# Patient Record
Sex: Female | Born: 1965
Health system: Southern US, Community
[De-identification: ages and names within clinical notes are randomized; demographics above are authoritative.]

## PROBLEM LIST (undated history)

## (undated) DIAGNOSIS — E119 Type 2 diabetes mellitus without complications: Secondary | ICD-10-CM

## (undated) DIAGNOSIS — F419 Anxiety disorder, unspecified: Secondary | ICD-10-CM

## (undated) DIAGNOSIS — I10 Essential (primary) hypertension: Secondary | ICD-10-CM

## (undated) HISTORY — PX: WRIST SURGERY: SHX841

## (undated) HISTORY — PX: CHOLECYSTECTOMY: SHX55

---

## 2004-12-09 ENCOUNTER — Ambulatory Visit: Payer: Self-pay | Admitting: General Surgery

## 2005-02-28 ENCOUNTER — Ambulatory Visit: Payer: Self-pay | Admitting: Family Medicine

## 2006-12-07 ENCOUNTER — Ambulatory Visit: Payer: Self-pay | Admitting: Family Medicine

## 2007-11-29 ENCOUNTER — Ambulatory Visit: Payer: Self-pay | Admitting: Family Medicine

## 2007-12-12 ENCOUNTER — Ambulatory Visit: Payer: Self-pay | Admitting: Obstetrics and Gynecology

## 2008-11-21 ENCOUNTER — Emergency Department: Payer: Self-pay | Admitting: Emergency Medicine

## 2008-12-07 ENCOUNTER — Emergency Department: Payer: Self-pay | Admitting: Unknown Physician Specialty

## 2009-08-25 ENCOUNTER — Emergency Department: Payer: Self-pay | Admitting: Emergency Medicine

## 2009-08-26 ENCOUNTER — Ambulatory Visit: Payer: Self-pay | Admitting: Orthopedic Surgery

## 2009-08-27 ENCOUNTER — Ambulatory Visit: Payer: Self-pay | Admitting: Orthopedic Surgery

## 2010-07-19 ENCOUNTER — Ambulatory Visit: Payer: Self-pay

## 2011-04-14 ENCOUNTER — Ambulatory Visit: Payer: Self-pay | Admitting: Family Medicine

## 2011-06-20 ENCOUNTER — Ambulatory Visit: Payer: Self-pay | Admitting: Family Medicine

## 2011-07-15 ENCOUNTER — Ambulatory Visit: Payer: Self-pay | Admitting: Family Medicine

## 2011-08-15 ENCOUNTER — Ambulatory Visit: Payer: Self-pay | Admitting: Family Medicine

## 2012-02-14 ENCOUNTER — Ambulatory Visit: Payer: Self-pay | Admitting: Family Medicine

## 2012-06-19 ENCOUNTER — Ambulatory Visit: Payer: Self-pay | Admitting: Specialist

## 2012-06-19 DIAGNOSIS — Z01810 Encounter for preprocedural cardiovascular examination: Secondary | ICD-10-CM

## 2012-06-19 LAB — FOLATE: Folic Acid: 17.1 ng/mL

## 2012-06-19 LAB — COMPREHENSIVE METABOLIC PANEL WITH GFR
Albumin: 3 g/dL — ABNORMAL LOW
Alkaline Phosphatase: 62 U/L
Anion Gap: 9
BUN: 10 mg/dL
Bilirubin,Total: 0.3 mg/dL
Calcium, Total: 8.5 mg/dL
Chloride: 107 mmol/L
Co2: 25 mmol/L
Creatinine: 0.65 mg/dL
EGFR (African American): >60
EGFR (Non-African Amer.): >60
Glucose: 102 mg/dL — ABNORMAL HIGH
Osmolality: 280
Potassium: 3.7 mmol/L
SGOT(AST): 15 U/L
SGPT (ALT): 14 U/L
Sodium: 141 mmol/L
Total Protein: 7.3 g/dL

## 2012-06-19 LAB — CBC WITH DIFFERENTIAL/PLATELET
Basophil #: 0.1 x10 3/mm 3
Basophil %: 0.6 %
Eosinophil #: 0.5 x10 3/mm 3
Eosinophil %: 4.9 %
HCT: 34.7 % — ABNORMAL LOW
HGB: 11.7 g/dL — ABNORMAL LOW
Lymphocyte %: 38.3 %
Lymphs Abs: 3.6 x10 3/mm 3
MCH: 30.5 pg
MCHC: 33.6 g/dL
MCV: 91 fL
Monocyte #: 0.5 "x10 3/mm "
Monocyte %: 5.2 %
Neutrophil #: 4.8 x10 3/mm 3
Neutrophil %: 51 %
Platelet: 305 x10 3/mm 3
RBC: 3.82 X10 6/mm 3
RDW: 14 %
WBC: 9.4 x10 3/mm 3

## 2012-06-19 LAB — FERRITIN: Ferritin (ARMC): 29 ng/mL (ref 8–388)

## 2012-06-19 LAB — LIPASE, BLOOD: Lipase: 90 U/L

## 2012-06-19 LAB — HEMOGLOBIN A1C: Hemoglobin A1C: 6.2 %

## 2012-06-19 LAB — BILIRUBIN, DIRECT: Bilirubin, Direct: <0.1 mg/dL

## 2012-06-19 LAB — IRON AND TIBC
Iron Bind.Cap.(Total): 414 ug/dL
Iron Saturation: 14 %
Iron: 58 ug/dL
Unbound Iron-Bind.Cap.: 356 ug/dL

## 2012-06-19 LAB — MAGNESIUM: Magnesium: 1.7 mg/dL — ABNORMAL LOW

## 2012-06-19 LAB — AMYLASE: Amylase: 31 U/L

## 2012-06-19 LAB — PROTIME-INR
INR: 1
Prothrombin Time: 13.7 s

## 2012-06-19 LAB — APTT: Activated PTT: 27.2 s

## 2012-06-19 LAB — PHOSPHORUS: Phosphorus: 3.5 mg/dL

## 2012-07-08 ENCOUNTER — Ambulatory Visit: Payer: Self-pay | Admitting: Specialist

## 2012-07-14 ENCOUNTER — Ambulatory Visit: Payer: Self-pay | Admitting: Specialist

## 2014-01-08 DIAGNOSIS — M5430 Sciatica, unspecified side: Secondary | ICD-10-CM | POA: Insufficient documentation

## 2014-01-08 DIAGNOSIS — E119 Type 2 diabetes mellitus without complications: Secondary | ICD-10-CM | POA: Insufficient documentation

## 2014-01-08 DIAGNOSIS — I10 Essential (primary) hypertension: Secondary | ICD-10-CM | POA: Insufficient documentation

## 2014-02-04 ENCOUNTER — Ambulatory Visit: Payer: Self-pay | Admitting: Family Medicine

## 2014-03-27 DIAGNOSIS — D649 Anemia, unspecified: Secondary | ICD-10-CM | POA: Insufficient documentation

## 2014-04-22 DIAGNOSIS — S62102A Fracture of unspecified carpal bone, left wrist, initial encounter for closed fracture: Secondary | ICD-10-CM | POA: Insufficient documentation

## 2014-05-04 DIAGNOSIS — N83201 Unspecified ovarian cyst, right side: Secondary | ICD-10-CM | POA: Insufficient documentation

## 2014-06-15 ENCOUNTER — Ambulatory Visit: Payer: Self-pay | Admitting: Family Medicine

## 2015-02-26 ENCOUNTER — Encounter: Payer: Self-pay | Admitting: Emergency Medicine

## 2015-02-26 ENCOUNTER — Emergency Department
Admission: EM | Admit: 2015-02-26 | Discharge: 2015-02-26 | Disposition: A | Discharge status: Home / Self Care | Payer: Self-pay | Attending: Emergency Medicine | Admitting: Emergency Medicine

## 2015-02-26 DIAGNOSIS — J069 Acute upper respiratory infection, unspecified: Secondary | ICD-10-CM | POA: Insufficient documentation

## 2015-02-26 DIAGNOSIS — Z79899 Other long term (current) drug therapy: Secondary | ICD-10-CM | POA: Insufficient documentation

## 2015-02-26 DIAGNOSIS — E119 Type 2 diabetes mellitus without complications: Secondary | ICD-10-CM | POA: Insufficient documentation

## 2015-02-26 DIAGNOSIS — I10 Essential (primary) hypertension: Secondary | ICD-10-CM | POA: Insufficient documentation

## 2015-02-26 HISTORY — DX: Anxiety disorder, unspecified: F41.9

## 2015-02-26 HISTORY — DX: Essential (primary) hypertension: I10

## 2015-02-26 HISTORY — DX: Type 2 diabetes mellitus without complications: E11.9

## 2015-02-26 LAB — POCT RAPID STREP A: STREPTOCOCCUS, GROUP A SCREEN (DIRECT): NEGATIVE

## 2015-02-26 MED ORDER — AMOXICILLIN 500 MG PO CAPS: 500.0000 mg | ORAL_CAPSULE | Freq: Three times a day (TID) | ORAL | Status: DC

## 2015-02-26 MED ORDER — BENZONATATE 100 MG PO CAPS: ORAL_CAPSULE | ORAL | Status: DC

## 2015-02-26 NOTE — ED Notes (Signed)
Patient with no complaints at this time. Respirations even and unlabored. Skin warm/dry. Discharge instructions reviewed with patient at this time. Patient given opportunity to voice concerns/ask questions. Patient discharged at this time and left Emergency Department with steady gait.   

## 2015-02-26 NOTE — ED Provider Notes (Signed)
[facility] Emergency Department Provider Note  ____________________________________________  Time seen: Approximately 6:11 PM  I have reviewed the triage vital signs and the nursing notes.   HISTORY  Chief Complaint Sore Throat   HPI Valerie Hamilton is a 49 y.o. female is here today with complaint of sore throat.Patient also complains of low-grade temp, productive cough, and congestion. She's been taking some over-the-counter medication without any improvement. She states the cough is keeping her up at night. She has continued to eat and drink without any difficulty.   she rates her pain a 5 out of 10.   Past Medical History  Diagnosis Date  . Diabetes mellitus without complication   . Hypertension   . Anxiety     There are no active problems to display for this patient.   Past Surgical History  Procedure Laterality Date  . Cholecystectomy    . Wrist surgery Left     Current Outpatient Rx  Name  Route  Sig  Dispense  Refill  . hydrochlorothiazide (HYDRODIURIL) 25 MG tablet   Oral   Take 25 mg by mouth daily.         Marland Kitchen amoxicillin (AMOXIL) 500 MG capsule   Oral   Take 1 capsule (500 mg total) by mouth 3 (three) times daily.   30 capsule   0   . benzonatate (TESSALON PERLES) 100 MG capsule      Take 1-2 caps every 8 hours for cough   30 capsule   0     Allergies Avelox and Lisinopril  No family history on file.  Social History History  Substance Use Topics  . Smoking status: Never Smoker   . Smokeless tobacco: Not on file  . Alcohol Use: Yes    Review of Systems Constitutional: Positive fever/chills Eyes: No visual changes. ENT: Positive sore throat. Cardiovascular: Denies chest pain. Respiratory: Denies shortness of breath. Gastrointestinal: No abdominal pain.  No nausea, no vomiting.  No diarrhea.  . Genitourinary: Negative for dysuria. Musculoskeletal: Negative for back pain. Skin: Negative for  rash. Neurological: Negative for headaches, focal weakness or numbness.  10-point ROS otherwise negative.  ____________________________________________   PHYSICAL EXAM:  VITAL SIGNS: ED Triage Vitals  Enc Vitals Group     BP 02/26/15 1758 139/96 mmHg     Pulse Rate 02/26/15 1758 105     Resp 02/26/15 1758 20     Temp 02/26/15 1758 99.3 F (37.4 C)     Temp Source 02/26/15 1758 Oral     SpO2 02/26/15 1758 99 %     Weight 02/26/15 1758 398 lb (180.532 kg)     Height 02/26/15 1758  (1.626 m)     Head Cir --      Peak Flow --      Pain Score 02/26/15 1755 5     Pain Loc --      Pain Edu? --      Excl. in GC? --     Constitutional: Alert and oriented. Well appearing and in no acute distress. Eyes: Conjunctivae are normal. PERRL. EOMI. Head: Atraumatic. Nose: No congestion/rhinnorhea. Mouth/Throat: Mucous membranes are moist.  Oropharynx non-erythematous with posterior drainage. Neck: No stridor.  Supple Hematological/Lymphatic/Immunilogical: No cervical lymphadenopathy. Cardiovascular: Normal rate, regular rhythm. Grossly normal heart sounds.  Good peripheral circulation. Respiratory: Normal respiratory effort.  No retractions. Lungs CTAB. Congested cough Gastrointestinal: Soft and nontender. No distention. No abdominal bruits. No CVA tenderness. Musculoskeletal: No lower extremity tenderness nor edema.  No joint effusions. Neurologic:  Normal speech and language. No gross focal neurologic deficits are appreciated. No gait instability. Skin:  Skin is warm, dry and intact. No rash noted. Psychiatric: Mood and affect are normal. Speech and behavior are normal.  ____________________________________________   LABS (all labs ordered are listed, but only abnormal results are displayed)  Labs Reviewed  POCT RAPID STREP A   _ PROCEDURES  Procedure(s) performed: None  Critical Care performed: No  ____________________________________________   INITIAL IMPRESSION /  ASSESSMENT AND PLAN / ED COURSE  Pertinent labs & imaging results that were available during my care of the patient were reviewed by me and considered in my medical decision making (see chart for details).  Patient was given a prescription for Tessalon Perles and amoxicillin 500 mg. She was told this is probably viral in nature however she is certain that this is much more than that. She is to follow-up with her PCP if any continued problems. ____________________________________________   FINAL CLINICAL IMPRESSION(S) / ED DIAGNOSES  Final diagnoses:  Upper respiratory infection      Valerie RumpsRhonda L Raidyn Breiner, PA-C 02/26/15 1953  Myrna Blazeravid Matthew Schaevitz, MD 02/26/15 [phone]

## 2015-02-26 NOTE — Discharge Instructions (Signed)

## 2015-09-20 ENCOUNTER — Other Ambulatory Visit: Payer: Self-pay | Admitting: Family Medicine

## 2015-09-20 DIAGNOSIS — Z1239 Encounter for other screening for malignant neoplasm of breast: Secondary | ICD-10-CM

## 2015-09-21 DIAGNOSIS — F32A Depression, unspecified: Secondary | ICD-10-CM | POA: Insufficient documentation

## 2016-06-25 IMAGING — CR DG LUMBAR SPINE 2-3V
1 series · 3 of 3 positions shown · non-contrast
Comparison: None.

CLINICAL DATA: Low back pain for years, radiating to the left leg.
No known injury.

EXAM:
LUMBAR SPINE - 2-3 VIEW

[Series 1: dxr lumbar spine with obliques · 0.14mm/px · 3 of 3 slices shown]
[im 1/3]
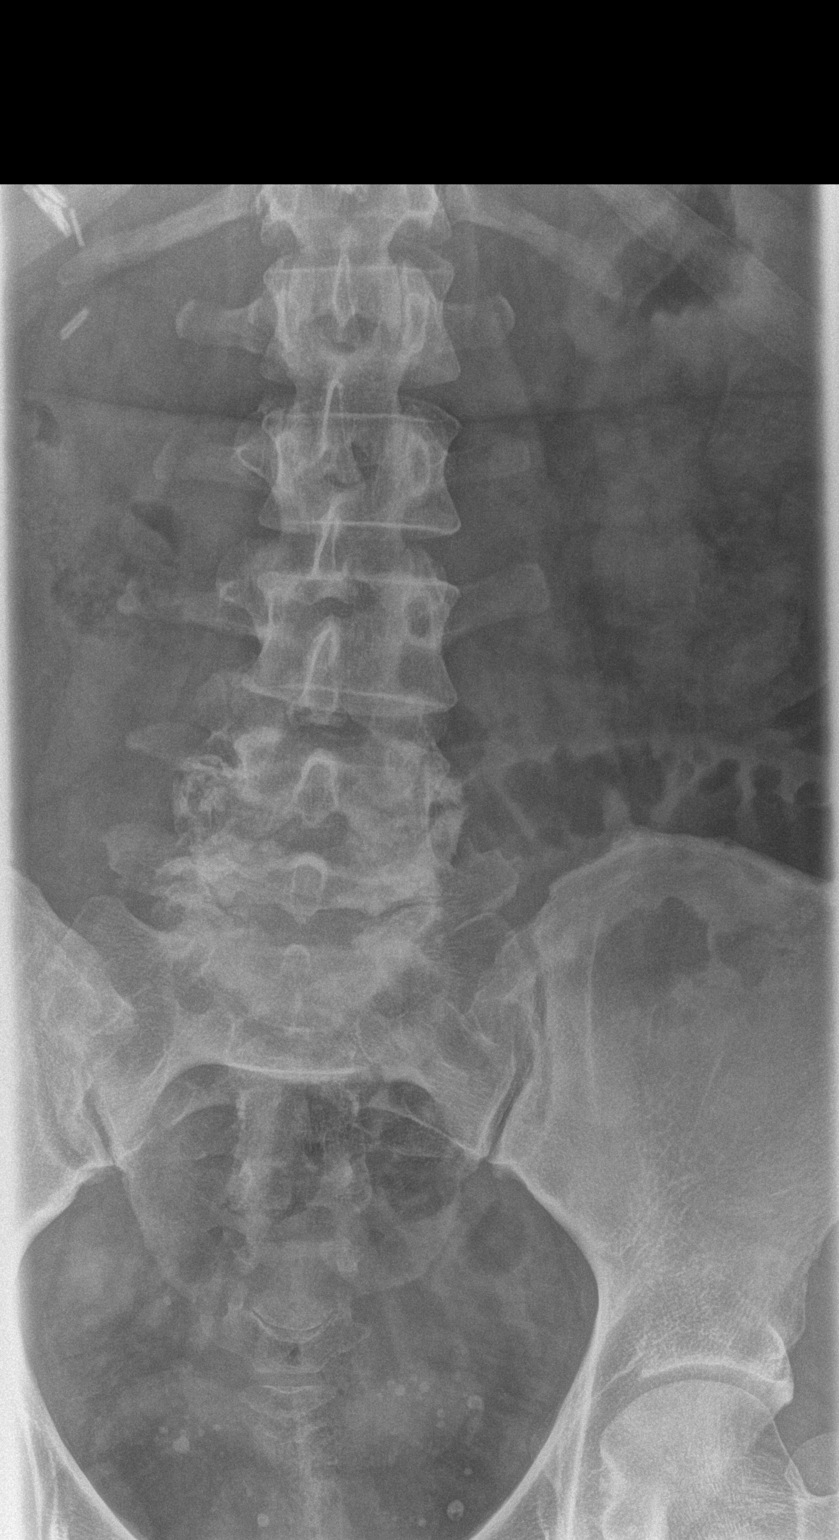
[im 2/3]
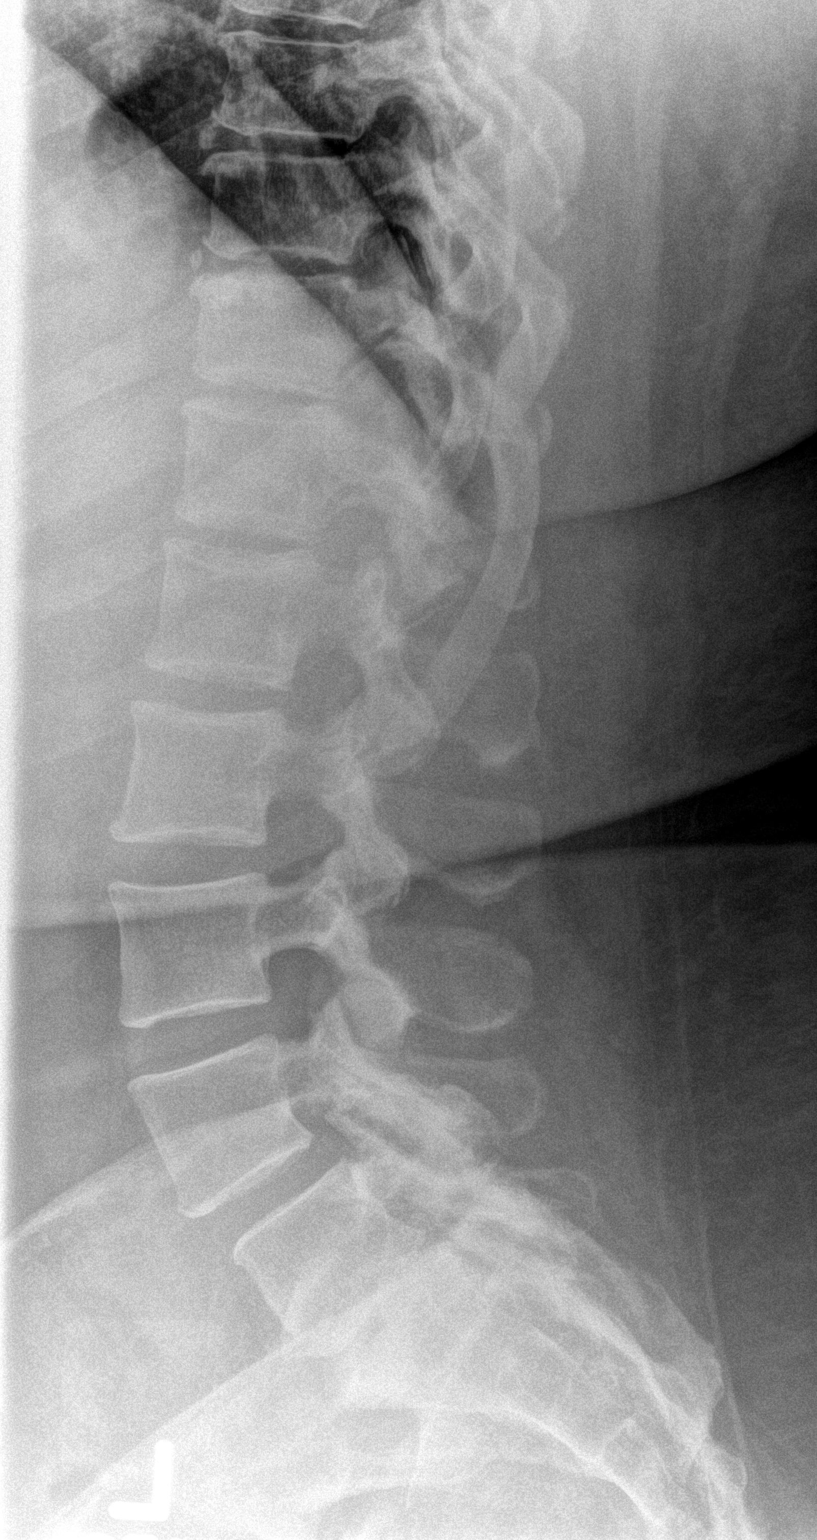
[im 3/3]
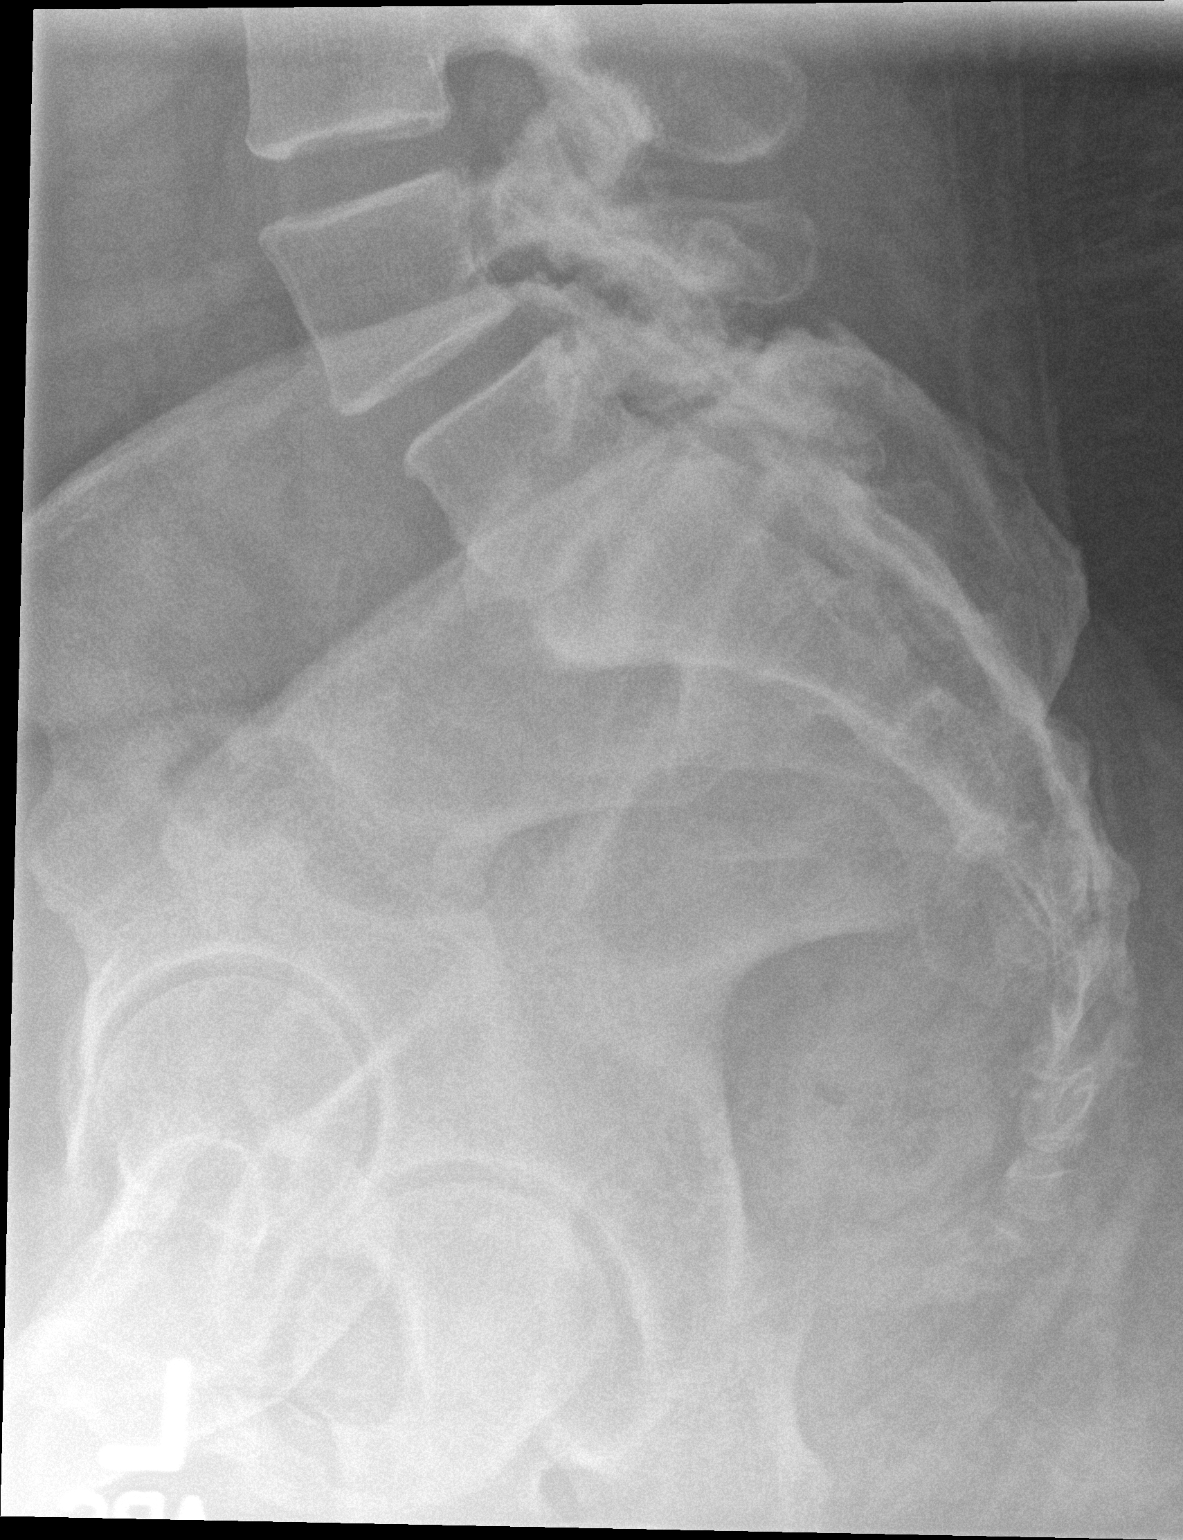

[3 of 3 positions shown; findings below may reference images not displayed]

FINDINGS: No acute findings such as fracture or endplate erosion. No focal
bone lesion.

There is advanced facet degeneration at L4-5 and L5-S1 with bone
overgrowth and subchondral irregularity. Low grade 1 slip present at
L4-5. No significant degenerative changes to the lumbar discs.
Spondylosis noted in the lower thoracic spine.
IMPRESSION: L4-5 and L5-S1 facet osteoarthritis with mild L4-5 slip.

## 2018-09-20 DIAGNOSIS — M1711 Unilateral primary osteoarthritis, right knee: Secondary | ICD-10-CM | POA: Insufficient documentation

## 2020-01-05 DIAGNOSIS — M1712 Unilateral primary osteoarthritis, left knee: Secondary | ICD-10-CM | POA: Insufficient documentation

## 2020-06-23 LAB — HEMOGLOBIN A1C: Hemoglobin A1C: 11.5

## 2022-03-30 ENCOUNTER — Encounter: Payer: Self-pay | Admitting: Internal Medicine

## 2022-03-30 ENCOUNTER — Ambulatory Visit (INDEPENDENT_AMBULATORY_CARE_PROVIDER_SITE_OTHER): Payer: 59 | Admitting: Internal Medicine

## 2022-03-30 VITALS — BP 132/88 | HR 95 | Resp 16 | Ht 64.0 in | Wt 369.0 lb

## 2022-03-30 DIAGNOSIS — Z1231 Encounter for screening mammogram for malignant neoplasm of breast: Secondary | ICD-10-CM | POA: Diagnosis not present

## 2022-03-30 DIAGNOSIS — D649 Anemia, unspecified: Secondary | ICD-10-CM

## 2022-03-30 DIAGNOSIS — N939 Abnormal uterine and vaginal bleeding, unspecified: Secondary | ICD-10-CM

## 2022-03-30 DIAGNOSIS — R69 Illness, unspecified: Secondary | ICD-10-CM | POA: Diagnosis not present

## 2022-03-30 DIAGNOSIS — Z0000 Encounter for general adult medical examination without abnormal findings: Secondary | ICD-10-CM

## 2022-03-30 DIAGNOSIS — Z13220 Encounter for screening for lipoid disorders: Secondary | ICD-10-CM

## 2022-03-30 DIAGNOSIS — Z113 Encounter for screening for infections with a predominantly sexual mode of transmission: Secondary | ICD-10-CM | POA: Diagnosis not present

## 2022-03-30 DIAGNOSIS — F332 Major depressive disorder, recurrent severe without psychotic features: Secondary | ICD-10-CM

## 2022-03-30 DIAGNOSIS — Z1159 Encounter for screening for other viral diseases: Secondary | ICD-10-CM | POA: Diagnosis not present

## 2022-03-30 DIAGNOSIS — E119 Type 2 diabetes mellitus without complications: Secondary | ICD-10-CM

## 2022-03-30 MED ORDER — BLOOD GLUCOSE METER KIT: PACK | 0 refills | Status: DC

## 2022-03-30 MED ORDER — SERTRALINE HCL 50 MG PO TABS: 50.0000 mg | ORAL_TABLET | Freq: Every day | ORAL | 1 refills | Status: DC

## 2022-03-30 MED ORDER — ADULT BLOOD PRESSURE CUFF LG KIT: 1.0000 | PACK | Freq: Every day | 0 refills | Status: AC

## 2022-03-30 NOTE — Progress Notes (Signed)
New Patient Office Visit  Subjective    Patient ID: Valerie Hamilton, female    DOB: 1966/02/21  Age: 56 y.o. MRN: [id number]  CC:  Chief Complaint  Patient presents with   Establish Care    HPI Valerie Hamilton presents to establish care.  Hypertension: -First diagnosed before 13-Oct-2014 -Medications: Nothing currently -Had been treated with HCTZ in the past but is not taking currently -Checking BP at home (average): Not checking  -Denies any SOB, CP, vision changes, LE edema or symptoms of hypotension  Diabetes, Type 2: -Last A1c 11/21 11.5  -Medications: Nothing currently  -Had been on Lantus and Ozempic in the past but not on anything currently -Checking BG at home: Not checking, needs supplies -Eye exam: Due -Foot exam: Due, will discuss at follow-up -Microalbumin: Due today -Statin: No -PNA vaccine: Discuss at follow-up -Denies symptoms of hypoglycemia, polyuria, polydipsia, numbness extremities, foot ulcers/trauma.   MDD: -Mood status: uncontrolled -Current treatment: Nothing -Symptom severity: severe - patient's son passed away from [place] in Oct 14, 2019 Psychotherapy/counseling: no  Previous psychiatric medications: zoloft Depressed mood: yes     03/30/2022   11:13 AM  Depression screen PHQ 2/9  Decreased Interest 3  Down, Depressed, Hopeless 3  PHQ - 2 Score 6  Altered sleeping 3  Tired, decreased energy 3  Change in appetite 3  Feeling bad or failure about yourself  2  Trouble concentrating 2  Moving slowly or fidgety/restless 0  Suicidal thoughts 0  PHQ-9 Score 19   Abnormal Vaginal Bleeding: -Having irregular periods, will continuously bleeding for several weeks -Not currently bleeding -Does not remember her last menstrual period -Had been on combined OCPs in the past, this was discontinued secondary to either blood pressure issues or history of migraines -Has been on Depo in the past, however this caused a large amount of weight gain. -Last Pap  3/20 which was negative  Health Maintenance: -Blood work due  -Mammogram due  -Colon cancer screening due - will discuss at follow up  Outpatient Encounter Medications as of 03/30/2022  Medication Sig   hydrochlorothiazide (HYDRODIURIL) 25 MG tablet Take 25 mg by mouth daily.   [DISCONTINUED] amoxicillin (AMOXIL) 500 MG capsule Take 1 capsule (500 mg total) by mouth 3 (three) times daily.   [DISCONTINUED] benzonatate (TESSALON PERLES) 100 MG capsule Take 1-2 caps every 8 hours for cough   No facility-administered encounter medications on file as of 03/30/2022.    Past Medical History:  Diagnosis Date   Anxiety    Diabetes mellitus without complication ([place])    Hypertension     Past Surgical History:  Procedure Laterality Date   CHOLECYSTECTOMY     WRIST SURGERY Left     No family history on file.  Social History   Socioeconomic History   Marital status: Single    Spouse name: Not on file   Number of children: Not on file   Years of education: Not on file   Highest education level: Not on file  Occupational History   Not on file  Tobacco Use   Smoking status: Never   Smokeless tobacco: Not on file  Substance and Sexual Activity   Alcohol use: Yes   Drug use: Not on file   Sexual activity: Not on file  Other Topics Concern   Not on file  Social History Narrative   Not on file   Social Determinants of Health   Financial Resource Strain: Not on file  Food Insecurity: Not on  file  Transportation Needs: Not on file  Physical Activity: Not on file  Stress: Not on file  Social Connections: Not on file  Intimate Partner Violence: Not on file    Review of Systems  Constitutional:  Negative for chills and fever.  Eyes:  Negative for blurred vision.  Respiratory:  Negative for shortness of breath.   Cardiovascular:  Negative for chest pain.  Neurological:  Positive for headaches. Negative for dizziness.        Objective    BP 132/88   Pulse 95   Resp  16   Ht 5' 4" (1.626 m)   Wt (!) 369 lb (167.4 kg)   SpO2 99%   BMI 63.34 kg/m   Physical Exam Constitutional:      Appearance: Normal appearance. She is obese.  HENT:     Head: Normocephalic and atraumatic.  Eyes:     Conjunctiva/sclera: Conjunctivae normal.  Cardiovascular:     Rate and Rhythm: Normal rate and regular rhythm.  Pulmonary:     Effort: Pulmonary effort is normal.     Breath sounds: Normal breath sounds.  Musculoskeletal:        General: Swelling present.  Lymphadenopathy:     Cervical: No cervical adenopathy.  Skin:    General: Skin is warm and dry.  Neurological:     General: No focal deficit present.     Mental Status: She is alert. Mental status is at baseline.  Psychiatric:        Mood and Affect: Mood normal.        Behavior: Behavior normal.         Assessment & Plan:   1. Encounter for medical examination to establish care/Type 2 diabetes mellitus without complication, without long-term current use of insulin ([place]): Uncontrolled diabetes, last A1c elevated in 2021. Will obtain new A1c today, CMP, CBC and microalbumin. Referral to Ophthalmology for diabetic eye exam placed today. Blood glucose monitoring kit and supplies ordered today as well so she can start monitoring blood glucose at home. Follow up in 1 week to discuss medications.   - HgB A1c - Comprehensive metabolic panel - Urine Microalbumin w/creat. ratio - Ambulatory referral to Ophthalmology - blood glucose meter kit and supplies; Dispense based on patient and insurance preference. Use up to four times daily as directed. (FOR ICD-10 E10.9, E11.9).  Dispense: 1 each; Refill: 0  2. Severe episode of recurrent major depressive disorder, without psychotic features ([place]): Uncontrolled. Severe loss in 2021, has not had medical care since. Had been on Zoloft in the past and has done well, will restart Zoloft 50 mg daily. Follow up in 6 weeks to recheck.  - sertraline (ZOLOFT) 50 MG tablet;  Take 1 tablet (50 mg total) by mouth daily.  Dispense: 30 tablet; Refill: 1  3. Abnormal vaginal bleedingAnemia, unspecified type: Check hemoglobin, iron and TSH. If normal, will obtain vaginal [place].   - CBC with Differential/Platelet - TSH - Fe+TIBC+Fer  4. Lipid screening/Need for hepatitis C screening test/Screen for STD (sexually transmitted disease): Screening due today.  -Lipid Profile - Hepatitis C Antibody - HIV antibody (with reflex)  5. Breast cancer screening by mammogram: Mammogram ordered today.   - MM 3D SCREEN BREAST BILATERAL; Future   Return in about 1 week (around 04/06/2022).   Teodora Medici, DO

## 2022-03-30 NOTE — Patient Instructions (Signed)
It was great seeing you today!  Plan discussed at today's visit: -Blood work ordered today, results will be uploaded to [facility].  -Blood pressure cuff sent to pharmacy, start checking 2-3 times a week -Glucose monitor and kit sent to pharmacy -Start Zoloft 50 mg daily  -Mammogram ordered, call to schedule  Follow up in: 1 week  Take care and let us know if you have any questions or concerns prior to your next visit.  Dr. Rosana Berger

## 2022-03-31 ENCOUNTER — Ambulatory Visit: Payer: Self-pay | Admitting: *Deleted

## 2022-03-31 LAB — CBC WITH DIFFERENTIAL/PLATELET
Absolute Monocytes: 537 cells/uL (ref 200–950)
Basophils Absolute: 70 cells/uL (ref 0–200)
Basophils Relative: 0.8 %
Eosinophils Absolute: 370 cells/uL (ref 15–500)
Eosinophils Relative: 4.2 %
HCT: 36.3 % (ref 35.0–45.0)
Hemoglobin: 11.9 g/dL (ref 11.7–15.5)
Lymphs Abs: 3846 cells/uL (ref 850–3900)
MCH: 29.9 pg (ref 27.0–33.0)
MCHC: 32.8 g/dL (ref 32.0–36.0)
MCV: 91.2 fL (ref 80.0–100.0)
MPV: 9.7 fL (ref 7.5–12.5)
Monocytes Relative: 6.1 %
Neutro Abs: 3978 cells/uL (ref 1500–7800)
Neutrophils Relative %: 45.2 %
Platelets: 290 10*3/uL (ref 140–400)
RBC: 3.98 10*6/uL (ref 3.80–5.10)
RDW: 12.4 % (ref 11.0–15.0)
Total Lymphocyte: 43.7 %
WBC: 8.8 10*3/uL (ref 3.8–10.8)

## 2022-03-31 LAB — COMPREHENSIVE METABOLIC PANEL
AG Ratio: 1.1 (calc) (ref 1.0–2.5)
ALT: 16 U/L (ref 6–29)
AST: 14 U/L (ref 10–35)
Albumin: 3.6 g/dL (ref 3.6–5.1)
Alkaline phosphatase (APISO): 78 U/L (ref 37–153)
BUN: 9 mg/dL (ref 7–25)
CO2: 29 mmol/L (ref 20–32)
Calcium: 8.8 mg/dL (ref 8.6–10.4)
Chloride: 102 mmol/L (ref 98–110)
Creat: 0.68 mg/dL (ref 0.50–1.03)
Globulin: 3.3 g/dL (calc) (ref 1.9–3.7)
Glucose, Bld: 229 mg/dL — ABNORMAL HIGH (ref 65–99)
Potassium: 4.3 mmol/L (ref 3.5–5.3)
Sodium: 138 mmol/L (ref 135–146)
Total Bilirubin: 0.5 mg/dL (ref 0.2–1.2)
Total Protein: 6.9 g/dL (ref 6.1–8.1)

## 2022-03-31 LAB — MICROALBUMIN / CREATININE URINE RATIO
Creatinine, Urine: 241 mg/dL (ref 20–275)
Microalb Creat Ratio: 36 mcg/mg creat — ABNORMAL HIGH (ref <30)
Microalb, Ur: 8.6 mg/dL

## 2022-03-31 LAB — LIPID PANEL
Cholesterol: 183 mg/dL (ref <200)
HDL: 46 mg/dL — ABNORMAL LOW (ref >=50)
LDL Cholesterol (Calc): 117 mg/dL (calc) — ABNORMAL HIGH
Non-HDL Cholesterol (Calc): 137 mg/dL (calc) — ABNORMAL HIGH (ref <130)
Total CHOL/HDL Ratio: 4 (calc) (ref <5.0)
Triglycerides: 92 mg/dL (ref <150)

## 2022-03-31 LAB — IRON,TIBC AND FERRITIN PANEL
%SAT: 15 % (calc) — ABNORMAL LOW (ref 16–45)
Ferritin: 11 ng/mL — ABNORMAL LOW (ref 16–232)
Iron: 54 ug/dL (ref 45–160)
TIBC: 363 mcg/dL (calc) (ref 250–450)

## 2022-03-31 LAB — HIV ANTIBODY (ROUTINE TESTING W REFLEX): HIV 1&2 Ab, 4th Generation: NONREACTIVE

## 2022-03-31 LAB — TSH: TSH: 5.11 mIU/L — ABNORMAL HIGH

## 2022-03-31 LAB — HEMOGLOBIN A1C
Hgb A1c MFr Bld: 11.3 % of total Hgb — ABNORMAL HIGH (ref <5.7)
Mean Plasma Glucose: 278 mg/dL
eAG (mmol/L): 15.4 mmol/L

## 2022-03-31 LAB — HEPATITIS C ANTIBODY: Hepatitis C Ab: NONREACTIVE

## 2022-03-31 MED ORDER — IRON (FERROUS SULFATE) 325 (65 FE) MG PO TABS: 325.0000 mg | ORAL_TABLET | Freq: Every day | ORAL | 1 refills | Status: AC

## 2022-03-31 NOTE — Addendum Note (Signed)
Addended by: Margarita Mail on: 03/31/2022 08:21 AM   Modules accepted: Orders

## 2022-03-31 NOTE — Telephone Encounter (Signed)
Pt given lab results per notes of E. Caralee Ates, DO  on 03/31/22. Pt verbalized understanding  and results will be discussed at f/u appt 04/12/22.

## 2022-04-04 ENCOUNTER — Telehealth: Payer: Self-pay | Admitting: Internal Medicine

## 2022-04-04 NOTE — Telephone Encounter (Signed)
Pt called in to make provider aware. Pt says that she was told by her pharmacy that her Rx for testing supplies has to be written separately.   Pt would like to have Rx sent to pharmacy below. ALSO, pt says that she is currently out of work. Pt was told by her insurance that they do not cover a bp cuff. Pt was told to track her BP.    Pharmacy: [id number] [place], [place] - [address]  [address] [place], [place] [address]  Phone:  [phone]  Fax:  [phone]

## 2022-04-04 NOTE — Telephone Encounter (Signed)
Copied from CRM [id number]. Topic: General - Other >> Apr 04, 2022  2:29 PM Pincus Sanes wrote: Reason for CRM: Pt just wanting to let dr know that her insurance denied BP cuff and needs a PA in order to proceed. Pt contact [phone]

## 2022-04-05 ENCOUNTER — Other Ambulatory Visit: Payer: Self-pay

## 2022-04-05 DIAGNOSIS — E119 Type 2 diabetes mellitus without complications: Secondary | ICD-10-CM

## 2022-04-05 MED ORDER — ACCU-CHEK GUIDE VI STRP: ORAL_STRIP | 12 refills | Status: AC

## 2022-04-05 MED ORDER — ACCU-CHEK SOFTCLIX LANCETS MISC: 12 refills | Status: AC

## 2022-04-06 ENCOUNTER — Ambulatory Visit: Payer: 59 | Admitting: Internal Medicine

## 2022-04-11 NOTE — Progress Notes (Unsigned)
New Patient Office Visit  Subjective    Patient ID: Valerie Hamilton, female    DOB: 02-Oct-1965  Age: 56 y.o. MRN: [id number]  CC:  No chief complaint on file.   HPI Valerie Hamilton presents to establish care.  Hypertension: -First diagnosed before 2014-10-17 -Medications: Nothing currently -Had been treated with HCTZ in the past but is not taking currently -Checking BP at home (average): Not checking  -Denies any SOB, CP, vision changes, LE edema or symptoms of hypotension  Diabetes, Type 2: -Last A1c 8/23 11.3 -Medications: Nothing currently  -Had been on Lantus and Ozempic in the past but not on anything currently -Checking BG at home: Not checking, needs supplies -Eye exam: Due -Foot exam: Due, will discuss at follow-up -Microalbumin: UTD -Statin: No -PNA vaccine: Discuss at follow-up -Denies symptoms of hypoglycemia, polyuria, polydipsia, numbness extremities, foot ulcers/trauma.   HLD:  -Medications: *** -Patient is compliant with above medications and reports no side effects. *** -Last lipid panel: Lipid Panel     Component Value Date/Time   CHOL 183 03/30/2022 1205   TRIG 92 03/30/2022 1205   HDL 46 (L) 03/30/2022 1205   CHOLHDL 4.0 03/30/2022 1205   LDLCALC 117 (H) 03/30/2022 1205    MDD: -Mood status: uncontrolled -Current treatment: Restarted on Zoloft 50 mg -Symptom severity: severe - patient's son passed away from [place] in 10-18-2019 Psychotherapy/counseling: no  Previous psychiatric medications: zoloft Depressed mood: yes     03/30/2022   11:13 AM  Depression screen PHQ 2/9  Decreased Interest 3  Down, Depressed, Hopeless 3  PHQ - 2 Score 6  Altered sleeping 3  Tired, decreased energy 3  Change in appetite 3  Feeling bad or failure about yourself  2  Trouble concentrating 2  Moving slowly or fidgety/restless 0  Suicidal thoughts 0  PHQ-9 Score 19   Abnormal Vaginal Bleeding/Iron Deficiency Anemia: -Having irregular periods, will  continuously bleeding for several weeks -Not currently bleeding -Does not remember her last menstrual period -Had been on combined OCPs in the past, this was discontinued secondary to either blood pressure issues or history of migraines -Has been on Depo in the past, however this caused a large amount of weight gain. -Last Pap 3/20 which was negative -Started on PO iron  Health Maintenance: -Blood work due  -Mammogram due  -Colon cancer screening due - will discuss at follow up  Outpatient Encounter Medications as of 04/12/2022  Medication Sig   Accu-Chek Softclix Lancets lancets Use as instructed   blood glucose meter kit and supplies Dispense based on patient and insurance preference. Use up to four times daily as directed. (FOR ICD-10 E10.9, E11.9).   Blood Pressure Monitoring (ADULT BLOOD PRESSURE CUFF LG) KIT 1 each by Does not apply route daily.   glucose blood (ACCU-CHEK GUIDE) test strip Use as instructed   Iron, Ferrous Sulfate, 325 (65 Fe) MG TABS Take 325 mg by mouth daily.   sertraline (ZOLOFT) 50 MG tablet Take 1 tablet (50 mg total) by mouth daily.   No facility-administered encounter medications on file as of 04/12/2022.    Past Medical History:  Diagnosis Date   Anxiety    Diabetes mellitus without complication ([place])    Hypertension     Past Surgical History:  Procedure Laterality Date   CHOLECYSTECTOMY     WRIST SURGERY Left     No family history on file.  Social History   Socioeconomic History   Marital status: Single  Spouse name: Not on file   Number of children: Not on file   Years of education: Not on file   Highest education level: Not on file  Occupational History   Not on file  Tobacco Use   Smoking status: Never   Smokeless tobacco: Not on file  Substance and Sexual Activity   Alcohol use: Yes   Drug use: Not on file   Sexual activity: Not on file  Other Topics Concern   Not on file  Social History Narrative   Not on file    Social Determinants of Health   Financial Resource Strain: Not on file  Food Insecurity: Not on file  Transportation Needs: Not on file  Physical Activity: Not on file  Stress: Not on file  Social Connections: Not on file  Intimate Partner Violence: Not on file    Review of Systems  Constitutional:  Negative for chills and fever.  Eyes:  Negative for blurred vision.  Respiratory:  Negative for shortness of breath.   Cardiovascular:  Negative for chest pain.  Neurological:  Positive for headaches. Negative for dizziness.        Objective    There were no vitals taken for this visit.  Physical Exam Constitutional:      Appearance: Normal appearance. She is obese.  HENT:     Head: Normocephalic and atraumatic.  Eyes:     Conjunctiva/sclera: Conjunctivae normal.  Cardiovascular:     Rate and Rhythm: Normal rate and regular rhythm.  Pulmonary:     Effort: Pulmonary effort is normal.     Breath sounds: Normal breath sounds.  Musculoskeletal:        General: Swelling present.  Lymphadenopathy:     Cervical: No cervical adenopathy.  Skin:    General: Skin is warm and dry.  Neurological:     General: No focal deficit present.     Mental Status: She is alert. Mental status is at baseline.  Psychiatric:        Mood and Affect: Mood normal.        Behavior: Behavior normal.         Assessment & Plan:   1. Encounter for medical examination to establish care/Type 2 diabetes mellitus without complication, without long-term current use of insulin ([place]): Uncontrolled diabetes, last A1c elevated in 2021. Will obtain new A1c today, CMP, CBC and microalbumin. Referral to Ophthalmology for diabetic eye exam placed today. Blood glucose monitoring kit and supplies ordered today as well so she can start monitoring blood glucose at home. Follow up in 1 week to discuss medications.   - HgB A1c - Comprehensive metabolic panel - Urine Microalbumin w/creat. ratio - Ambulatory  referral to Ophthalmology - blood glucose meter kit and supplies; Dispense based on patient and insurance preference. Use up to four times daily as directed. (FOR ICD-10 E10.9, E11.9).  Dispense: 1 each; Refill: 0  2. Severe episode of recurrent major depressive disorder, without psychotic features ([place]): Uncontrolled. Severe loss in 2021, has not had medical care since. Had been on Zoloft in the past and has done well, will restart Zoloft 50 mg daily. Follow up in 6 weeks to recheck.  - sertraline (ZOLOFT) 50 MG tablet; Take 1 tablet (50 mg total) by mouth daily.  Dispense: 30 tablet; Refill: 1  3. Abnormal vaginal bleedingAnemia, unspecified type: Check hemoglobin, iron and TSH. If normal, will obtain vaginal [place].   - CBC with Differential/Platelet - TSH - Fe+TIBC+Fer  4. Lipid screening/Need for  hepatitis C screening test/Screen for STD (sexually transmitted disease): Screening due today.  -Lipid Profile - Hepatitis C Antibody - HIV antibody (with reflex)  5. Breast cancer screening by mammogram: Mammogram ordered today.   - MM 3D SCREEN BREAST BILATERAL; Future   No follow-ups on file.   Teodora Medici, DO

## 2022-04-12 ENCOUNTER — Ambulatory Visit (INDEPENDENT_AMBULATORY_CARE_PROVIDER_SITE_OTHER): Payer: 59 | Admitting: Internal Medicine

## 2022-04-12 ENCOUNTER — Encounter: Payer: Self-pay | Admitting: Internal Medicine

## 2022-04-12 VITALS — BP 142/82 | HR 104 | Temp 98.1°F | Resp 16 | Ht 64.0 in | Wt 371.2 lb

## 2022-04-12 DIAGNOSIS — F331 Major depressive disorder, recurrent, moderate: Secondary | ICD-10-CM | POA: Diagnosis not present

## 2022-04-12 DIAGNOSIS — D649 Anemia, unspecified: Secondary | ICD-10-CM | POA: Diagnosis not present

## 2022-04-12 DIAGNOSIS — N939 Abnormal uterine and vaginal bleeding, unspecified: Secondary | ICD-10-CM

## 2022-04-12 DIAGNOSIS — E782 Mixed hyperlipidemia: Secondary | ICD-10-CM | POA: Diagnosis not present

## 2022-04-12 DIAGNOSIS — I10 Essential (primary) hypertension: Secondary | ICD-10-CM

## 2022-04-12 DIAGNOSIS — E119 Type 2 diabetes mellitus without complications: Secondary | ICD-10-CM | POA: Diagnosis not present

## 2022-04-12 DIAGNOSIS — R69 Illness, unspecified: Secondary | ICD-10-CM | POA: Diagnosis not present

## 2022-04-12 MED ORDER — BLOOD GLUCOSE METER KIT: PACK | 0 refills | Status: AC

## 2022-04-12 MED ORDER — TRULICITY 0.75 MG/0.5ML ~~LOC~~ SOAJ: 0.7500 mg | SUBCUTANEOUS | 0 refills | Status: DC

## 2022-04-12 MED ORDER — INSULIN GLARGINE 100 UNIT/ML ~~LOC~~ SOLN: 20.0000 [IU] | Freq: Every day | SUBCUTANEOUS | 3 refills | Status: DC

## 2022-04-12 MED ORDER — SERTRALINE HCL 100 MG PO TABS: 100.0000 mg | ORAL_TABLET | Freq: Every day | ORAL | 1 refills | Status: DC

## 2022-04-12 MED ORDER — ATORVASTATIN CALCIUM 20 MG PO TABS: 20.0000 mg | ORAL_TABLET | Freq: Every day | ORAL | 1 refills | Status: AC

## 2022-04-12 NOTE — Patient Instructions (Signed)
It was great seeing you today!  Plan discussed at today's visit: -Restart Lantus 20 units at night -Start checking sugar in the morning before eating, write this down and bring to follow up  -I will send low dose Trulicity to see about insurance coverage, if they cover will start once weekly injections -New cholesterol medication sent to pharmacy -Zoloft increased to 100 mg daily -Continue oral iron every other day, recommend taking it with vitamin C and stool softener    Follow up in: 1 month   Take care and let us know if you have any questions or concerns prior to your next visit.  Dr. Caralee Ates

## 2022-04-13 ENCOUNTER — Telehealth: Payer: Self-pay | Admitting: Internal Medicine

## 2022-04-13 ENCOUNTER — Other Ambulatory Visit: Payer: Self-pay | Admitting: Internal Medicine

## 2022-04-13 DIAGNOSIS — E119 Type 2 diabetes mellitus without complications: Secondary | ICD-10-CM

## 2022-04-13 NOTE — Telephone Encounter (Signed)
Copied from CRM [id number]. Topic: General - Other >> Apr 13, 2022  9:17 AM Everette C wrote: Reason for CRM: [place] with [facility] has called to go over prior authorization criteria questions with a member of clinical staff  Please contact further when possible  PA #[id number]

## 2022-04-13 NOTE — Telephone Encounter (Signed)
Done through cover my meds

## 2022-04-14 ENCOUNTER — Other Ambulatory Visit: Payer: Self-pay

## 2022-04-14 NOTE — Telephone Encounter (Signed)
Requested medication (s) are due for refill today: another medication is requested by pharmacy.  Requested medication (s) are on the active medication list: yes  Last refill:  04/12/22  Future visit scheduled: yes  Notes to clinic:  Unable to refill per protocol, last refill by provider on 04/12/22. Pharmacy request Alternative Requested:[facility].      Requested Prescriptions  Pending Prescriptions Disp Refills   Insulin Glargine (BASAGLAR KWIKPEN) 100 UNIT/ML [Pharmacy Med Name: BASAGLAR 100 UNIT/ML KWIKPEN]  0    Sig: Inject 0.2 mLs (20 Units total) into the skin daily.     Endocrinology:  Diabetes - Insulins Failed - 04/13/2022 12:17 PM      Failed - HBA1C is between 0 and 7.9 and within 180 days    Hemoglobin A1C  Date Value Ref Range Status  06/23/2020 11.5  Final   Hgb A1c MFr Bld  Date Value Ref Range Status  03/30/2022 11.3 (H) <5.7 % of total Hgb Final    Comment:    For someone without known diabetes, a hemoglobin A1c value of 6.5% or greater indicates that they may have  diabetes and this should be confirmed with a follow-up  test. . For someone with known diabetes, a value <7% indicates  that their diabetes is well controlled and a value  greater than or equal to 7% indicates suboptimal  control. A1c targets should be individualized based on  duration of diabetes, age, comorbid conditions, and  other considerations. . Currently, no consensus exists regarding use of hemoglobin A1c for diagnosis of diabetes for children. Verna Czech - Valid encounter within last 6 months    Recent Outpatient Visits           2 days ago Type 2 diabetes mellitus without complication, without long-term current use of insulin [facility])   [facility] Margarita Mail, DO   2 weeks ago Encounter for medical examination to establish care   [facility] Margarita Mail, DO       Future Appointments              In 1 month Margarita Mail, DO [facility], [facility]

## 2022-04-21 NOTE — Telephone Encounter (Signed)
Copied from CRM [id number]. Topic: General - Other >> Apr 21, 2022 12:25 PM Everette C wrote: Reason for CRM: The patient has returned a missed call from J. Graves  Please contact again when possible

## 2022-04-21 NOTE — Telephone Encounter (Signed)
Returned call went to VM

## 2022-04-21 NOTE — Telephone Encounter (Signed)
Pt called back ins will cover with prior auth victoza or ozempic

## 2022-04-26 ENCOUNTER — Other Ambulatory Visit: Payer: Self-pay | Admitting: Internal Medicine

## 2022-04-26 DIAGNOSIS — E119 Type 2 diabetes mellitus without complications: Secondary | ICD-10-CM

## 2022-04-26 MED ORDER — VICTOZA 18 MG/3ML ~~LOC~~ SOPN: 1.2000 mg | PEN_INJECTOR | Freq: Every day | SUBCUTANEOUS | 2 refills | Status: DC

## 2022-04-26 NOTE — Telephone Encounter (Signed)
Left detailed vm °

## 2022-05-09 ENCOUNTER — Ambulatory Visit: Payer: Self-pay | Admitting: Internal Medicine

## 2022-05-10 ENCOUNTER — Telehealth: Payer: Self-pay | Admitting: Internal Medicine

## 2022-05-10 NOTE — Telephone Encounter (Signed)
Medication Refill - Medication: patient just said needles, didn't have the name of them, this is what shows on med list insulin glargine (LANTUS) 100 UNIT/ML injection [[id number]]  DISCONTINUED  Has the patient contacted their pharmacy? yes (Agent: If no, request that the patient contact the pharmacy for the refill. If patient does not wish to contact the pharmacy document the reason why and proceed with request.) (Agent: If yes, when and what did the pharmacy advise?)contact pcp  Preferred Pharmacy (with phone number or street name):  [id number] - [place], [place] Phone:  [phone]  Fax:  [phone]     Has the patient been seen for an appointment in the last year OR does the patient have an upcoming appointment? yes  Agent: Please be advised that RX refills may take up to 3 business days. We ask that you follow-up with your pharmacy.

## 2022-05-14 ENCOUNTER — Other Ambulatory Visit: Payer: Self-pay | Admitting: Internal Medicine

## 2022-05-14 DIAGNOSIS — E119 Type 2 diabetes mellitus without complications: Secondary | ICD-10-CM

## 2022-05-14 MED ORDER — CARETOUCH PEN NEEDLES 32G X 4 MM MISC: 1.0000 | Freq: Every day | 3 refills | Status: AC

## 2022-05-15 NOTE — Telephone Encounter (Signed)
Tried to leave v/m

## 2022-05-16 ENCOUNTER — Ambulatory Visit: Payer: 59 | Admitting: Internal Medicine

## 2022-05-16 NOTE — Progress Notes (Deleted)
Established Patient Office Visit  Subjective    Patient ID: Valerie Hamilton, female    DOB: 1965/09/11  Age: 56 y.o. MRN: [id number]  CC:  No chief complaint on file.   HPI Valerie Hamilton presents for follow up.   Hypertension: -First diagnosed before 2014-10-03 -Medications: Nothing currently -Had been treated with HCTZ in the past but is not taking currently -Checking BP at home (average): Not checking  -Denies any SOB, CP, vision changes, LE edema or symptoms of hypotension  Diabetes, Type 2: -Last A1c 8/23 11.3 -Medications: Started back on Lantus 20 units at night, Trulicity 0.09 mg weekly  -Had been on Lantus and Ozempic in the past but not on anything currently - tolerated well previously but could not afford without her insurance  -Checking BG at home: Not checking, needs supplies -Eye exam: Due -scheduled in October  -Foot exam: UTD 8/23 -Microalbumin: UTD - consider ACE/ARB -Statin: Not yet  -PNA vaccine: Discuss at follow-up -Denies symptoms of hypoglycemia, polyuria, polydipsia, numbness extremities, foot ulcers/trauma.   HLD: -Medications: Restarted Lipitor 20 mg at LOV -Last lipid panel: Lipid Panel     Component Value Date/Time   CHOL 183 03/30/2022 1205   TRIG 92 03/30/2022 1205   HDL 46 (L) 03/30/2022 1205   CHOLHDL 4.0 03/30/2022 1205   LDLCALC 117 (H) 03/30/2022 1205   The 10-year ASCVD risk score (Arnett DK, et al., 03-Oct-2017) is: 10.8%   Values used to calculate the score:     Age: 79 years     Sex: Female     Is Non-Hispanic African American: Yes     Diabetic: Yes     Tobacco smoker: No     Systolic Blood Pressure: 381 mmHg     Is BP treated: No     HDL Cholesterol: 46 mg/dL     Total Cholesterol: 183 mg/dL   MDD: -Mood status: better -Current treatment: Zoloft increased to 100 mg at LOV -Symptom severity: severe - patient's son passed away from [place] in 2019/10/04 Psychotherapy/counseling: no  Previous psychiatric medications:  zoloft Depressed mood: yes     04/12/2022    9:58 AM 03/30/2022   11:13 AM  Depression screen PHQ 2/9  Decreased Interest 1 3  Down, Depressed, Hopeless 2 3  PHQ - 2 Score 3 6  Altered sleeping 0 3  Tired, decreased energy 0 3  Change in appetite 0 3  Feeling bad or failure about yourself  0 2  Trouble concentrating 0 2  Moving slowly or fidgety/restless 0 0  Suicidal thoughts 0 0  PHQ-9 Score 3 19  Difficult doing work/chores Not difficult at all    Abnormal Vaginal Bleeding/Iron Deficiency Anemia: -Having irregular periods, will continuously bleed for several weeks -Not currently bleeding, periods becoming more irregular  -Had been on combined OCPs in the past, this was discontinued secondary to either blood pressure issues or history of migraines -Has been on Depo in the past, however this caused a large amount of weight gain. -Last Pap 3/20 which was negative -Started on PO iron which she is taking every other day - does make her constipated but will start a stool softener to take with it.   Health Maintenance: -Blood work UTD -Mammogram due, not yet scheduled -Colon cancer screening due - will discuss at follow up  Outpatient Encounter Medications as of 05/16/2022  Medication Sig   Accu-Chek Softclix Lancets lancets Use as instructed   atorvastatin (LIPITOR) 20 MG tablet Take 1  tablet (20 mg total) by mouth daily.   blood glucose meter kit and supplies Dispense based on patient and insurance preference. Use up to four times daily as directed. (FOR ICD-10 E10.9, E11.9).   Blood Pressure Monitoring (ADULT BLOOD PRESSURE CUFF LG) KIT 1 each by Does not apply route daily.   glucose blood (ACCU-CHEK GUIDE) test strip Use as instructed   Insulin Glargine (BASAGLAR KWIKPEN) 100 UNIT/ML Inject 20 Units into the skin daily.   Insulin Pen Needle (CARETOUCH PEN NEEDLES) 32G X 4 MM MISC 1 each by Does not apply route daily.   Iron, Ferrous Sulfate, 325 (65 Fe) MG TABS Take 325 mg by  mouth daily.   liraglutide (VICTOZA) 18 MG/3ML SOPN Inject 1.2 mg into the skin daily.   sertraline (ZOLOFT) 100 MG tablet Take 1 tablet (100 mg total) by mouth daily.   No facility-administered encounter medications on file as of 05/16/2022.    Past Medical History:  Diagnosis Date   Anxiety    Diabetes mellitus without complication ([place])    Hypertension     Past Surgical History:  Procedure Laterality Date   CHOLECYSTECTOMY     WRIST SURGERY Left     No family history on file.  Social History   Socioeconomic History   Marital status: Single    Spouse name: Not on file   Number of children: Not on file   Years of education: Not on file   Highest education level: Not on file  Occupational History   Not on file  Tobacco Use   Smoking status: Never   Smokeless tobacco: Not on file  Substance and Sexual Activity   Alcohol use: Yes   Drug use: Not on file   Sexual activity: Not on file  Other Topics Concern   Not on file  Social History Narrative   Not on file   Social Determinants of Health   Financial Resource Strain: Not on file  Food Insecurity: Not on file  Transportation Needs: Not on file  Physical Activity: Not on file  Stress: Not on file  Social Connections: Not on file  Intimate Partner Violence: Not on file    Review of Systems  Constitutional:  Negative for chills and fever.  Eyes:  Negative for blurred vision.  Respiratory:  Negative for shortness of breath.   Cardiovascular:  Negative for chest pain.  Neurological:  Negative for dizziness.        Objective    There were no vitals taken for this visit.  Physical Exam Constitutional:      Appearance: Normal appearance. She is obese.  HENT:     Head: Normocephalic and atraumatic.  Eyes:     Conjunctiva/sclera: Conjunctivae normal.  Cardiovascular:     Rate and Rhythm: Normal rate and regular rhythm.     Pulses:          Dorsalis pedis pulses are 2+ on the right side and 2+ on the  left side.  Pulmonary:     Effort: Pulmonary effort is normal.     Breath sounds: Normal breath sounds.  Musculoskeletal:     Right lower leg: Edema present.     Left lower leg: Edema present.     Right foot: Normal range of motion. No deformity, bunion, Charcot foot, foot drop or prominent metatarsal heads.     Left foot: Normal range of motion. No deformity, bunion, Charcot foot, foot drop or prominent metatarsal heads.     Comments: Chronic BLE  swelling, worse on the left   Feet:     Right foot:     Protective Sensation: 6 sites tested.  6 sites sensed.     Skin integrity: Skin integrity normal.     Toenail Condition: Right toenails are normal.     Left foot:     Protective Sensation: 6 sites tested.       Skin integrity: Skin integrity normal.     Toenail Condition: Left toenails are normal.  Lymphadenopathy:     Cervical: No cervical adenopathy.  Skin:    General: Skin is warm and dry.  Neurological:     General: No focal deficit present.     Mental Status: She is alert. Mental status is at baseline.  Psychiatric:        Mood and Affect: Mood normal.        Behavior: Behavior normal.         Assessment & Plan:   1. Type 2 diabetes mellitus without complication, without long-term current use of insulin ([place]): A1c uncontrolled at 11.3.  We will restart her Lantus 20 units at night.  We will start the lowest dose of Trulicity as well.  A new blood glucose meter kit and supplies was sent to [facility] so patient can start checking her fasting sugars.  She has a diabetic eye exam next month.  Diabetic foot exam today.  Follow-up in 1 month to recheck.  - insulin glargine (LANTUS) 100 UNIT/ML injection; Inject 0.2 mLs (20 Units total) into the skin daily.  Dispense: 10 mL; Refill: 3 - Dulaglutide (TRULICITY) 0.10 UV/2.5DG SOPN; Inject 0.75 mg into the skin once a week.  Dispense: 2 mL; Refill: 0 - blood glucose meter kit and supplies; Dispense based on patient and insurance  preference. Use up to four times daily as directed. (FOR ICD-10 E10.9, E11.9).  Dispense: 1 each; Refill: 0 - HM Diabetes Foot Exam  2. Hypertension, unspecified type: Blood pressure slightly elevated today.  No medication started today, we will recheck it again next month.  If it is still elevated will start an ACE or an ARB for renal protection.  3. Moderate mixed hyperlipidemia not requiring statin therapy: LDL elevated at 117, ASCVD risk elevated at 10.8%.  Patient had been on Lipitor in the past, will restart Lipitor 20 mg daily.  - atorvastatin (LIPITOR) 20 MG tablet; Take 1 tablet (20 mg total) by mouth daily.  Dispense: 90 tablet; Refill: 1  4. Moderate episode of recurrent major depressive disorder [facility]): Zoloft dose increased to 100 mg daily.  Recheck at follow-up.  - sertraline (ZOLOFT) 100 MG tablet; Take 1 tablet (100 mg total) by mouth daily.  Dispense: 30 tablet; Refill: 1  5. Abnormal vaginal bleeding/Anemia, unspecified type: Hemoglobin level normal, however ferritin low at 11.  She was started on oral iron therapy and is taking it every other day with vitamin C and stool softeners.  She has had a vaginal ultrasound in the past, which was only significant for a small ovarian cyst.  Her periods are becoming more irregular, she would like to wait before referral to gynecology as the patient is most likely peri-menopausal at this point.    No follow-ups on file.   Teodora Medici, DO

## 2022-06-14 ENCOUNTER — Other Ambulatory Visit: Payer: Self-pay | Admitting: Internal Medicine

## 2022-06-14 DIAGNOSIS — F331 Major depressive disorder, recurrent, moderate: Secondary | ICD-10-CM

## 2022-06-14 NOTE — Telephone Encounter (Signed)
Requested Prescriptions  Pending Prescriptions Disp Refills  . sertraline (ZOLOFT) 100 MG tablet [Pharmacy Med Name: SERTRALINE HCL 100 MG TABLET] 30 tablet 0    Sig: TAKE 1 TABLET BY MOUTH EVERY DAY     Psychiatry:  Antidepressants - SSRI - sertraline Passed - 06/14/2022  1:47 AM      Passed - AST in normal range and within 360 days    AST  Date Value Ref Range Status  03/30/2022 14 10 - 35 U/L Final   SGOT(AST)  Date Value Ref Range Status  06/19/2012 15 15 - 37 Unit/L Final         Passed - ALT in normal range and within 360 days    ALT  Date Value Ref Range Status  03/30/2022 16 6 - 29 U/L Final   SGPT (ALT)  Date Value Ref Range Status  06/19/2012 14 12 - 78 U/L Final         Passed - Completed PHQ-2 or PHQ-9 in the last 360 days      Passed - Valid encounter within last 6 months    Recent Outpatient Visits          2 months ago Type 2 diabetes mellitus without complication, without long-term current use of insulin [facility])   [place], DO   2 months ago Encounter for medical examination to establish care   [facility] Teodora Medici, [place]

## 2022-07-18 ENCOUNTER — Other Ambulatory Visit: Payer: Self-pay | Admitting: Internal Medicine

## 2022-07-18 DIAGNOSIS — F332 Major depressive disorder, recurrent severe without psychotic features: Secondary | ICD-10-CM

## 2022-07-18 NOTE — Telephone Encounter (Signed)
Dosage changed to 100mg . Requested Prescriptions  Pending Prescriptions Disp Refills   sertraline (ZOLOFT) 50 MG tablet [Pharmacy Med Name: SERTRALINE HCL 50 MG TABLET] 30 tablet 1    Sig: TAKE 1 TABLET BY MOUTH EVERY DAY     Psychiatry:  Antidepressants - SSRI - sertraline Passed - 07/18/2022  1:05 AM      Passed - AST in normal range and within 360 days    AST  Date Value Ref Range Status  03/30/2022 14 10 - 35 U/L Final   SGOT(AST)  Date Value Ref Range Status  06/19/2012 15 15 - 37 Unit/L Final         Passed - ALT in normal range and within 360 days    ALT  Date Value Ref Range Status  03/30/2022 16 6 - 29 U/L Final   SGPT (ALT)  Date Value Ref Range Status  06/19/2012 14 12 - 78 U/L Final         Passed - Completed PHQ-2 or PHQ-9 in the last 360 days      Passed - Valid encounter within last 6 months    Recent Outpatient Visits           3 months ago Type 2 diabetes mellitus without complication, without long-term current use of insulin [facility])   [facility], DO   3 months ago Encounter for medical examination to establish care   [facility], Margarita Mail

## 2022-07-24 NOTE — Progress Notes (Deleted)
Established Patient Office Visit  Subjective    Patient ID: Valerie Hamilton, female    DOB: [birth year]  Age: 56 y.o. MRN: [id number]  CC:  No chief complaint on file.   HPI [name] presents for follow up.   Hypertension: -First diagnosed before 2014/10/11 -Medications: Nothing currently -Had been treated with HCTZ in the past but is not taking currently -Checking BP at home (average): Not checking  -Denies any SOB, CP, vision changes, LE edema or symptoms of hypotension  Diabetes, Type 2: -Last A1c 8/23 11.3 -Medications: Lantus 20 units at night, Trulicity   -Had been on Lantus and Ozempic in the past but not on anything currently - tolerated well previously but could not afford without her insurance  -Checking BG at home: Not checking, needs supplies -Eye exam: Due -scheduled in October  -Foot exam: UTD 8/23 -Microalbumin: UTD - consider ACE/ARB -Statin: Not yet  -PNA vaccine: Discuss at follow-up -Denies symptoms of hypoglycemia, polyuria, polydipsia, numbness extremities, foot ulcers/trauma.   HLD: -Medications: Lipitor 20 mg -Last lipid panel: Lipid Panel     Component Value Date/Time   CHOL 183 03/30/2022 1205   TRIG 92 03/30/2022 1205   HDL 46 (L) 03/30/2022 1205   CHOLHDL 4.0 03/30/2022 1205   LDLCALC 117 (H) 03/30/2022 1205   The 10-year ASCVD risk score (Arnett DK, et al., 10-11-2017) is: 10.8%   Values used to calculate the score:     Age: 77 years     Sex: Female     Is Non-Hispanic African American: Yes     Diabetic: Yes     Tobacco smoker: No     Systolic Blood Pressure: 774 mmHg     Is BP treated: No     HDL Cholesterol: 46 mg/dL     Total Cholesterol: 183 mg/dL   MDD: -Mood status: better -Current treatment: Zoloft 100 mg -Symptom severity: severe - patient's son passed away from [place] in 10/12/2019 Psychotherapy/counseling: no  Previous psychiatric medications: zoloft Depressed mood: yes     04/12/2022    9:58 AM 03/30/2022   11:13  AM  Depression screen PHQ 2/9  Decreased Interest 1 3  Down, Depressed, Hopeless 2 3  PHQ - 2 Score 3 6  Altered sleeping 0 3  Tired, decreased energy 0 3  Change in appetite 0 3  Feeling bad or failure about yourself  0 2  Trouble concentrating 0 2  Moving slowly or fidgety/restless 0 0  Suicidal thoughts 0 0  PHQ-9 Score 3 19  Difficult doing work/chores Not difficult at all    Abnormal Vaginal Bleeding/Iron Deficiency Anemia: -Having irregular periods, will continuously bleed for several weeks -Not currently bleeding, periods becoming more irregular  -Had been on combined OCPs in the past, this was discontinued secondary to either blood pressure issues or history of migraines -Has been on Depo in the past, however this caused a large amount of weight gain. -Last Pap 3/20 which was negative -Started on PO iron which she is taking every other day - does make her constipated but will start a stool softener to take with it.   Health Maintenance: -Blood work UTD -Mammogram due, not yet scheduled -Colon cancer screening due - will discuss at follow up  Outpatient Encounter Medications as of 07/25/2022  Medication Sig   Accu-Chek Softclix Lancets lancets Use as instructed   atorvastatin (LIPITOR) 20 MG tablet Take 1 tablet (20 mg total) by mouth daily.   blood glucose meter  kit and supplies Dispense based on patient and insurance preference. Use up to four times daily as directed. (FOR ICD-10 E10.9, E11.9).   Blood Pressure Monitoring (ADULT BLOOD PRESSURE CUFF LG) KIT 1 each by Does not apply route daily.   glucose blood (ACCU-CHEK GUIDE) test strip Use as instructed   Insulin Glargine (BASAGLAR KWIKPEN) 100 UNIT/ML Inject 20 Units into the skin daily.   Insulin Pen Needle (CARETOUCH PEN NEEDLES) 32G X 4 MM MISC 1 each by Does not apply route daily.   Iron, Ferrous Sulfate, 325 (65 Fe) MG TABS Take 325 mg by mouth daily.   liraglutide (VICTOZA) 18 MG/3ML SOPN Inject 1.2 mg into the  skin daily.   sertraline (ZOLOFT) 100 MG tablet TAKE 1 TABLET BY MOUTH EVERY DAY   No facility-administered encounter medications on file as of 07/25/2022.    Past Medical History:  Diagnosis Date   Anxiety    Diabetes mellitus without complication ([place])    Hypertension     Past Surgical History:  Procedure Laterality Date   CHOLECYSTECTOMY     WRIST SURGERY Left     No family history on file.  Social History   Socioeconomic History   Marital status: Single    Spouse name: Not on file   Number of children: Not on file   Years of education: Not on file   Highest education level: Not on file  Occupational History   Not on file  Tobacco Use   Smoking status: Never   Smokeless tobacco: Not on file  Substance and Sexual Activity   Alcohol use: Yes   Drug use: Not on file   Sexual activity: Not on file  Other Topics Concern   Not on file  Social History Narrative   Not on file   Social Determinants of Health   Financial Resource Strain: Not on file  Food Insecurity: Not on file  Transportation Needs: Not on file  Physical Activity: Not on file  Stress: Not on file  Social Connections: Not on file  Intimate Partner Violence: Not on file    Review of Systems  Constitutional:  Negative for chills and fever.  Eyes:  Negative for blurred vision.  Respiratory:  Negative for shortness of breath.   Cardiovascular:  Negative for chest pain.  Neurological:  Negative for dizziness.        Objective    There were no vitals taken for this visit.  Physical Exam Constitutional:      Appearance: Normal appearance. She is obese.  HENT:     Head: Normocephalic and atraumatic.  Eyes:     Conjunctiva/sclera: Conjunctivae normal.  Cardiovascular:     Rate and Rhythm: Normal rate and regular rhythm.     Pulses:          Dorsalis pedis pulses are 2+ on the right side and 2+ on the left side.  Pulmonary:     Effort: Pulmonary effort is normal.     Breath sounds:  Normal breath sounds.  Musculoskeletal:     Right lower leg: Edema present.     Left lower leg: Edema present.     Right foot: Normal range of motion. No deformity, bunion, Charcot foot, foot drop or prominent metatarsal heads.     Left foot: Normal range of motion. No deformity, bunion, Charcot foot, foot drop or prominent metatarsal heads.     Comments: Chronic BLE swelling, worse on the left   Feet:     Right foot:  Protective Sensation: 6 sites tested.  6 sites sensed.     Skin integrity: Skin integrity normal.     Toenail Condition: Right toenails are normal.     Left foot:     Protective Sensation: 6 sites tested.       Skin integrity: Skin integrity normal.     Toenail Condition: Left toenails are normal.  Lymphadenopathy:     Cervical: No cervical adenopathy.  Skin:    General: Skin is warm and dry.  Neurological:     General: No focal deficit present.     Mental Status: She is alert. Mental status is at baseline.  Psychiatric:        Mood and Affect: Mood normal.        Behavior: Behavior normal.         Assessment & Plan:   1. Type 2 diabetes mellitus without complication, without long-term current use of insulin ([place]): A1c uncontrolled at 11.3.  We will restart her Lantus 20 units at night.  We will start the lowest dose of Trulicity as well.  A new blood glucose meter kit and supplies was sent to [facility] so patient can start checking her fasting sugars.  She has a diabetic eye exam next month.  Diabetic foot exam today.  Follow-up in 1 month to recheck.  - insulin glargine (LANTUS) 100 UNIT/ML injection; Inject 0.2 mLs (20 Units total) into the skin daily.  Dispense: 10 mL; Refill: 3 - Dulaglutide (TRULICITY) 1.65 VV/7.4MO SOPN; Inject 0.75 mg into the skin once a week.  Dispense: 2 mL; Refill: 0 - blood glucose meter kit and supplies; Dispense based on patient and insurance preference. Use up to four times daily as directed. (FOR ICD-10 E10.9, E11.9).  Dispense:  1 each; Refill: 0 - HM Diabetes Foot Exam  2. Hypertension, unspecified type: Blood pressure slightly elevated today.  No medication started today, we will recheck it again next month.  If it is still elevated will start an ACE or an ARB for renal protection.  3. Moderate mixed hyperlipidemia not requiring statin therapy: LDL elevated at 117, ASCVD risk elevated at 10.8%.  Patient had been on Lipitor in the past, will restart Lipitor 20 mg daily.  - atorvastatin (LIPITOR) 20 MG tablet; Take 1 tablet (20 mg total) by mouth daily.  Dispense: 90 tablet; Refill: 1  4. Moderate episode of recurrent major depressive disorder [facility]): Zoloft dose increased to 100 mg daily.  Recheck at follow-up.  - sertraline (ZOLOFT) 100 MG tablet; Take 1 tablet (100 mg total) by mouth daily.  Dispense: 30 tablet; Refill: 1  5. Abnormal vaginal bleeding/Anemia, unspecified type: Hemoglobin level normal, however ferritin low at 11.  She was started on oral iron therapy and is taking it every other day with vitamin C and stool softeners.  She has had a vaginal ultrasound in the past, which was only significant for a small ovarian cyst.  Her periods are becoming more irregular, she would like to wait before referral to gynecology as the patient is most likely peri-menopausal at this point.    No follow-ups on file.   Teodora Medici, DO

## 2022-07-25 ENCOUNTER — Ambulatory Visit: Payer: 59 | Admitting: Internal Medicine

## 2022-07-27 ENCOUNTER — Ambulatory Visit (INDEPENDENT_AMBULATORY_CARE_PROVIDER_SITE_OTHER): Payer: 59 | Admitting: Family Medicine

## 2022-07-27 ENCOUNTER — Other Ambulatory Visit: Payer: Self-pay | Admitting: Family Medicine

## 2022-07-27 ENCOUNTER — Encounter: Payer: Self-pay | Admitting: Family Medicine

## 2022-07-27 VITALS — BP 128/80 | HR 99 | Temp 97.9°F | Resp 16 | Ht 64.0 in | Wt 384.2 lb

## 2022-07-27 DIAGNOSIS — R300 Dysuria: Secondary | ICD-10-CM

## 2022-07-27 DIAGNOSIS — R350 Frequency of micturition: Secondary | ICD-10-CM | POA: Diagnosis not present

## 2022-07-27 DIAGNOSIS — N760 Acute vaginitis: Secondary | ICD-10-CM | POA: Diagnosis not present

## 2022-07-27 DIAGNOSIS — R7989 Other specified abnormal findings of blood chemistry: Secondary | ICD-10-CM | POA: Diagnosis not present

## 2022-07-27 DIAGNOSIS — F339 Major depressive disorder, recurrent, unspecified: Secondary | ICD-10-CM | POA: Insufficient documentation

## 2022-07-27 DIAGNOSIS — I10 Essential (primary) hypertension: Secondary | ICD-10-CM

## 2022-07-27 DIAGNOSIS — F4321 Adjustment disorder with depressed mood: Secondary | ICD-10-CM

## 2022-07-27 DIAGNOSIS — E119 Type 2 diabetes mellitus without complications: Secondary | ICD-10-CM | POA: Diagnosis not present

## 2022-07-27 DIAGNOSIS — F331 Major depressive disorder, recurrent, moderate: Secondary | ICD-10-CM

## 2022-07-27 DIAGNOSIS — F410 Panic disorder [episodic paroxysmal anxiety] without agoraphobia: Secondary | ICD-10-CM

## 2022-07-27 DIAGNOSIS — Z634 Disappearance and death of family member: Secondary | ICD-10-CM

## 2022-07-27 LAB — POCT URINALYSIS DIPSTICK
Bilirubin, UA: NEGATIVE
Glucose, UA: NEGATIVE
Ketones, UA: NEGATIVE
Leukocytes, UA: NEGATIVE
Nitrite, UA: NEGATIVE
Protein, UA: POSITIVE — AB
Spec Grav, UA: 1.025 (ref 1.010–1.025)
Urobilinogen, UA: 0.2 E.U./dL
pH, UA: 6.5 (ref 5.0–8.0)

## 2022-07-27 MED ORDER — LORAZEPAM 0.5 MG PO TABS: 0.5000 mg | ORAL_TABLET | Freq: Two times a day (BID) | ORAL | 0 refills | Status: AC | PRN

## 2022-07-27 MED ORDER — LIRAGLUTIDE 18 MG/3ML ~~LOC~~ SOPN: 3.0000 mg | PEN_INJECTOR | Freq: Every day | SUBCUTANEOUS | 2 refills | Status: DC

## 2022-07-27 MED ORDER — SERTRALINE HCL 100 MG PO TABS: 100.0000 mg | ORAL_TABLET | Freq: Every day | ORAL | 0 refills | Status: AC

## 2022-07-27 MED ORDER — LIRAGLUTIDE 18 MG/3ML ~~LOC~~ SOPN: 1.8000 mg | PEN_INJECTOR | Freq: Every day | SUBCUTANEOUS | 0 refills | Status: DC

## 2022-07-27 MED ORDER — FLUCONAZOLE 150 MG PO TABS: 150.0000 mg | ORAL_TABLET | ORAL | 2 refills | Status: AC | PRN

## 2022-07-27 MED ORDER — LIRAGLUTIDE 18 MG/3ML ~~LOC~~ SOPN: 2.4000 mg | PEN_INJECTOR | Freq: Every day | SUBCUTANEOUS | 0 refills | Status: DC

## 2022-07-27 NOTE — Assessment & Plan Note (Signed)
BP currently at goal with/o meds, continue diet/lifestyle efforts Will likely need to add ARB for renal protection BP Readings from Last 3 Encounters:  07/27/22 128/80  04/12/22 (!) 142/82  03/30/22 132/88

## 2022-07-27 NOTE — Assessment & Plan Note (Addendum)
Uncontrolled, restarted meds 3 months ago with PCP Lab Results  Component Value Date   HGBA1C 11.3 (H) 03/30/2022  She reports CBG's 200-300+ that she did not notice much improvement in with addition of victoza Currently on victoza 1.2 mg daily and lantus 20 unit qhs Dose increase to max 3 mg daily of victoza - reviewed possible SE She can titrate lantus - but advised her not to do this the week of any victoza dose increases - educated and reviewed how to titrate basal insulin - currently on 20 units QHS Polyuria and vaginal irritation - likely recurrent yeast infection due do hyperglycemia and autodiuresing/glucosuria Tx with diflucan can repeat dose q 72 hour or weeks when symptomatic until sugars are more controlled She was given freestyle libre meter 3 month f/up with PCP but encouraged to come sooner if she needs help with med titrations or has SE/concerns

## 2022-07-27 NOTE — Assessment & Plan Note (Signed)
PHQ 9 score mildly positive but improved from her initial visit She is doing well on zoloft 100 mg no noted SE or concerns This time of year is very hard with recent death of son, anniveraries and birthdays, she is alone and living in a hotel, recommended she work with CCM SW Arboriculturist for resources (grief counseling an option and also to help her find housing etc). Supportive listening    07/27/2022   11:08 AM 07/27/2022   11:00 AM 04/12/2022    9:58 AM  Depression screen PHQ 2/9  Decreased Interest 2 0 1  Down, Depressed, Hopeless 2 0 2  PHQ - 2 Score 4 0 3  Altered sleeping 1 0 0  Tired, decreased energy 2 0 0  Change in appetite 0 0 0  Feeling bad or failure about yourself  0 0 0  Trouble concentrating 0 0 0  Moving slowly or fidgety/restless 0 0 0  Suicidal thoughts 0 0 0  PHQ-9 Score 7 0 3  Difficult doing work/chores Somewhat difficult Not difficult at all Not difficult at all  Con't zoloft same dose Rescue meds rx'd for severe sx - see other dx

## 2022-07-27 NOTE — Progress Notes (Signed)
 Established Patient Office Visit  Subjective    Patient ID: Valerie Hamilton, female    DOB: 06/18/1966  Age: 56 y.o. MRN: [id number]  CC:  Chief Complaint  Patient presents with   Follow-up   Diabetes    HPI Valerie Hamilton presents for f/up after starting tx for multiple uncontrolled conditions (last visit was 3 months ago with PCP, she was moved to my schedule today and note taken over that was previously prepped from her PCP) I did discuss the note/plan with Dr. Andrews today  Hypertension: BP Readings from Last 3 Encounters:  07/27/22 128/80  04/12/22 (!) 142/82  03/30/22 132/88  She notes a little swelling that is chronic to legs L>R, BP well controlled today  Allergy to lisinopril - may need to add losartan/ARB for renal protection -First diagnosed before 2016 -Medications: Nothing currently -Had been treated with HCTZ in the past but is not taking currently -Checking BP at home (average): Not checking  -Denies any SOB, CP, vision changes, LE edema or symptoms of hypotension  Diabetes, Type 2: -Last A1c 8/23 11.3 Lab Results  Component Value Date   HGBA1C 11.3 (H) 03/30/2022  She has been on victoza 1.2 mg dose, and doing basal insulin 20 units at night, sugars are still very high with polyuria -Medications: Lantus 20 units at night victoza  -Had been on Lantus and Ozempic in the past but not on anything currently - tolerated well previously but could not afford without her insurance  -Checking BG at home: Not checking, needs supplies -Eye exam: Due -scheduled in October  -Foot exam: UTD 8/23 -Microalbumin: UTD - consider ACE/ARB -Statin: Not yet  -PNA vaccine: Discuss at follow-up -Denies symptoms of hypoglycemia, polyuria, polydipsia, numbness extremities, foot ulcers/trauma.   .  HLD: -Medications: Lipitor 20 mg, restarted no concerning SE  -Last lipid panel: Lipid Panel     Component Value Date/Time   CHOL 183 03/30/2022 1205   TRIG 92  03/30/2022 1205   HDL 46 (L) 03/30/2022 1205   CHOLHDL 4.0 03/30/2022 1205   LDLCALC 117 (H) 03/30/2022 1205   The 10-year ASCVD risk score (Arnett DK, et al., 2019) is: 10.8%   Values used to calculate the score:     Age: 56 years     Sex: Female     Is Non-Hispanic African American: Yes     Diabetic: Yes     Tobacco smoker: No     Systolic Blood Pressure: 142 mmHg     Is BP treated: No     HDL Cholesterol: 46 mg/dL     Total Cholesterol: 183 mg/dL   MDD:   -Mood status: better - but this time of year is very hard for her with the recent traumatic death of her son, she was fired from her job, then lost her car and home, 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 and 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 and its not enough to improve her situation, she is overall feeling mood is better, but she still is having panic attacks, grief, sadness -Current treatment: Zoloft 100 mg - increased dose helped some, no SE  -Symptom severity: severe - patient's son passed away from COVID in 2021 Psychotherapy/counseling: no - open to possibly talking to grief counselor or CCM- SW [place] Previous psychiatric medications: zoloft Depressed mood: yes     07/27/2022   11:08 AM 07/27/2022   11:00 AM 04/12/2022    9:58 AM 03/30/2022     11:13 AM  Depression screen PHQ 2/9  Decreased Interest 2 0 1 3  Down, Depressed, Hopeless 2 0 2 3  PHQ - 2 Score 4 0 3 6  Altered sleeping 1 0 0 3  Tired, decreased energy 2 0 0 3  Change in appetite 0 0 0 3  Feeling bad or failure about yourself  0 0 0 2  Trouble concentrating 0 0 0 2  Moving slowly or fidgety/restless 0 0 0 0  Suicidal thoughts 0 0 0 0  PHQ-9 Score 7 0 3 19  Difficult doing work/chores Somewhat difficult Not difficult at all Not difficult at all    Abnormal Vaginal Bleeding/Iron Deficiency Anemia: She hasn't been able to tolerate the POP iron supplement  Health Maintenance: -Blood work UTD -Mammogram due, not yet scheduled -Colon cancer  screening due - will discuss at follow up  Outpatient Encounter Medications as of 07/27/2022  Medication Sig   Accu-Chek Softclix Lancets lancets Use as instructed   atorvastatin (LIPITOR) 20 MG tablet Take 1 tablet (20 mg total) by mouth daily.   blood glucose meter kit and supplies Dispense based on patient and insurance preference. Use up to four times daily as directed. (FOR ICD-10 E10.9, E11.9).   Blood Pressure Monitoring (ADULT BLOOD PRESSURE CUFF LG) KIT 1 each by Does not apply route daily.   glucose blood (ACCU-CHEK GUIDE) test strip Use as instructed   Insulin Glargine (BASAGLAR KWIKPEN) 100 UNIT/ML Inject 20 Units into the skin daily.   Insulin Pen Needle (CARETOUCH PEN NEEDLES) 32G X 4 MM MISC 1 each by Does not apply route daily.   Iron, Ferrous Sulfate, 325 (65 Fe) MG TABS Take 325 mg by mouth daily.   liraglutide (VICTOZA) 18 MG/3ML SOPN Inject 1.2 mg into the skin daily.   sertraline (ZOLOFT) 100 MG tablet TAKE 1 TABLET BY MOUTH EVERY DAY   No facility-administered encounter medications on file as of 07/27/2022.    Past Medical History:  Diagnosis Date   Anxiety    Diabetes mellitus without complication (HCC)    Hypertension     Past Surgical History:  Procedure Laterality Date   CHOLECYSTECTOMY     WRIST SURGERY Left     No family history on file.  Social History   Socioeconomic History   Marital status: Single    Spouse name: Not on file   Number of children: Not on file   Years of education: Not on file   Highest education level: Not on file  Occupational History   Not on file  Tobacco Use   Smoking status: Never   Smokeless tobacco: Not on file  Substance and Sexual Activity   Alcohol use: Yes   Drug use: Not on file   Sexual activity: Not on file  Other Topics Concern   Not on file  Social History Narrative   Not on file   Social Determinants of Health   Financial Resource Strain: Not on file  Food Insecurity: Not on file   Transportation Needs: Not on file  Physical Activity: Not on file  Stress: Not on file  Social Connections: Not on file  Intimate Partner Violence: Not on file    Review of Systems  Constitutional: Negative.  Negative for chills and fever.  HENT: Negative.    Eyes: Negative.  Negative for blurred vision.  Respiratory: Negative.  Negative for shortness of breath.   Cardiovascular: Negative.  Negative for chest pain.  Gastrointestinal: Negative.   Genitourinary:    Positive for frequency. Negative for dysuria, flank pain and hematuria.  Musculoskeletal: Negative.   Skin: Negative.   Neurological:  Negative for dizziness.  Endo/Heme/Allergies: Negative.   Psychiatric/Behavioral:  Positive for depression. Negative for hallucinations, substance abuse and suicidal ideas.         Objective    BP 128/80   Pulse 99   Temp 97.9 F (36.6 C) (Oral)   Resp 16   Ht 5' 4" (1.626 m)   Wt (!) 384 lb 3.2 oz (174.3 kg)   SpO2 96%   BMI 65.95 kg/m   Physical Exam Vitals and nursing note reviewed.  Constitutional:      General: She is not in acute distress.    Appearance: She is well-developed. She is obese. She is not ill-appearing, toxic-appearing or diaphoretic.  HENT:     Head: Normocephalic and atraumatic.     Nose: Nose normal.  Eyes:     General:        Right eye: No discharge.        Left eye: No discharge.     Conjunctiva/sclera: Conjunctivae normal.  Neck:     Trachea: No tracheal deviation.  Cardiovascular:     Rate and Rhythm: Normal rate and regular rhythm.     Pulses: Normal pulses.     Heart sounds: Normal heart sounds.  Pulmonary:     Effort: Pulmonary effort is normal. No respiratory distress.     Breath sounds: Normal breath sounds. No stridor.  Musculoskeletal:     Right lower leg: Edema present.     Left lower leg: Edema present.  Skin:    General: Skin is warm and dry.     Findings: No rash.  Neurological:     Mental Status: She is alert.     Motor:  No abnormal muscle tone.     Coordination: Coordination normal.  Psychiatric:        Attention and Perception: Attention normal.        Mood and Affect: Affect normal. Mood is depressed.        Speech: Speech normal.        Behavior: Behavior normal. Behavior is cooperative.        Thought Content: Thought content does not include homicidal or suicidal ideation. Thought content does not include homicidal or suicidal plan.         Assessment & Plan:    Problem List Items Addressed This Visit       Cardiovascular and Mediastinum   HTN (hypertension)    BP currently at goal with/o meds, continue diet/lifestyle efforts Will likely need to add ARB for renal protection BP Readings from Last 3 Encounters:  07/27/22 128/80  04/12/22 (!) 142/82  03/30/22 132/88        Relevant Orders   COMPLETE METABOLIC PANEL WITH GFR   AMB Referral to Community Care Coordinaton ([facility]O Patients)     Endocrine   Type 2 diabetes mellitus without complication, without long-term current use of insulin (HCC) - Primary    Uncontrolled, restarted meds 3 months ago with PCP Lab Results  Component Value Date   HGBA1C 11.3 (H) 03/30/2022  She reports CBG's 200-300+ that she did not notice much improvement in with addition of victoza Currently on victoza 1.2 mg daily and lantus 20 unit qhs Dose increase to max 3 mg daily of victoza - reviewed possible SE She can titrate lantus - but advised her not to do this the week of any victoza   dose increases - educated and reviewed how to titrate basal insulin - currently on 20 units QHS Polyuria and vaginal irritation - likely recurrent yeast infection due do hyperglycemia and autodiuresing/glucosuria Tx with diflucan can repeat dose q 72 hour or weeks when symptomatic until sugars are more controlled She was given freestyle libre meter 3 month f/up with PCP but encouraged to come sooner if she needs help with med titrations or has SE/concerns      Relevant  Medications   liraglutide (VICTOZA) 18 MG/3ML SOPN   liraglutide (VICTOZA) 18 MG/3ML SOPN (Start on 08/26/2022)   liraglutide (VICTOZA) 18 MG/3ML SOPN (Start on 09/25/2022)   Other Relevant Orders   POCT urinalysis dipstick (Completed)   Urine Culture   COMPLETE METABOLIC PANEL WITH GFR   Hemoglobin A1c   AMB Referral to [facility] (ACO Patients)     Other   Episode of recurrent major depressive disorder ([place])    PHQ 9 score mildly positive but improved from her initial visit She is doing well on zoloft 100 mg no noted SE or concerns This time of year is very hard with recent death of son, anniveraries and birthdays, she is alone and living in a hotel, recommended she work with [place] for resources (grief counseling an option and also to help her find housing etc). Supportive listening    07/27/2022   11:08 AM 07/27/2022   11:00 AM 04/12/2022    9:58 AM  Depression screen PHQ 2/9  Decreased Interest 2 0 1  Down, Depressed, Hopeless 2 0 2  PHQ - 2 Score 4 0 3  Altered sleeping 1 0 0  Tired, decreased energy 2 0 0  Change in appetite 0 0 0  Feeling bad or failure about yourself  0 0 0  Trouble concentrating 0 0 0  Moving slowly or fidgety/restless 0 0 0  Suicidal thoughts 0 0 0  PHQ-9 Score 7 0 3  Difficult doing work/chores Somewhat difficult Not difficult at all Not difficult at all  Con't zoloft same dose Rescue meds rx'd for severe sx - see other dx       Relevant Medications   LORazepam (ATIVAN) 0.5 MG tablet   sertraline (ZOLOFT) 100 MG tablet   Other Relevant Orders   TSH   AMB Referral to Community Care Coordinaton ([facility]O Patients)   Other Visit Diagnoses     Urine frequency       likely polyuria from high sugars and yeast infection   Relevant Orders   POCT urinalysis dipstick (Completed)   Urine Culture   Dysuria       doubt uti, likely yeast, urine culture added   Acute vaginitis       at risk with uncontrolled DM, diflucan to tx as  noted above   Relevant Medications   fluconazole (DIFLUCAN) 150 MG tablet   Other Relevant Orders   POCT urinalysis dipstick (Completed)   Abnormal TSH       recheck tsh with t4, she reports no prior hx of thyroid disease   Relevant Orders   TSH   T4, free   Grief at loss of child       see MDD and panic attacks - reent severe trauma finding her son dead at home 2019/09/21 due to COVID   Relevant Medications   LORazepam (ATIVAN) 0.5 MG tablet   Other Relevant Orders   AMB Referral to [facility] [facility] ([facility] Patients)   Panic attack  severe episodes of emotions/grief/flash backs, she can take low dose ativan for severe sx, reviewed risk, no refills, make med last, recommend grief counseling   Relevant Medications   LORazepam (ATIVAN) 0.5 MG tablet   sertraline (ZOLOFT) 100 MG tablet   Other Relevant Orders   AMB Referral to [facility] ([facility]O Patients)       F/up pcp in 3 months, can come sooner for CBG and med review CCM referral would be helpful - pharmacist to assist with med titration and SW - see above Delsa Grana, PA-C 07/27/22 12:30 PM

## 2022-07-27 NOTE — Telephone Encounter (Signed)
PA started please sign it will not let me done it

## 2022-07-28 ENCOUNTER — Telehealth: Payer: Self-pay

## 2022-07-28 ENCOUNTER — Telehealth: Payer: Self-pay | Admitting: *Deleted

## 2022-07-28 LAB — COMPLETE METABOLIC PANEL WITH GFR
AG Ratio: 1 (calc) (ref 1.0–2.5)
ALT: 22 U/L (ref 6–29)
AST: 19 U/L (ref 10–35)
Albumin: 3.6 g/dL (ref 3.6–5.1)
Alkaline phosphatase (APISO): 90 U/L (ref 37–153)
BUN: 8 mg/dL (ref 7–25)
CO2: 30 mmol/L (ref 20–32)
Calcium: 8.8 mg/dL (ref 8.6–10.4)
Chloride: 100 mmol/L (ref 98–110)
Creat: 0.58 mg/dL (ref 0.50–1.03)
Globulin: 3.7 g/dL (calc) (ref 1.9–3.7)
Glucose, Bld: 217 mg/dL — ABNORMAL HIGH (ref 65–99)
Potassium: 4.3 mmol/L (ref 3.5–5.3)
Sodium: 138 mmol/L (ref 135–146)
Total Bilirubin: 0.5 mg/dL (ref 0.2–1.2)
Total Protein: 7.3 g/dL (ref 6.1–8.1)
eGFR: 107 mL/min/{1.73_m2} (ref >=60)

## 2022-07-28 LAB — TSH: TSH: 5.42 mIU/L — ABNORMAL HIGH

## 2022-07-28 LAB — HEMOGLOBIN A1C
Hgb A1c MFr Bld: 10.7 % of total Hgb — ABNORMAL HIGH (ref <5.7)
Mean Plasma Glucose: 260 mg/dL
eAG (mmol/L): 14.4 mmol/L

## 2022-07-28 LAB — URINE CULTURE
MICRO NUMBER:: 14316290
SPECIMEN QUALITY:: ADEQUATE

## 2022-07-28 LAB — T4, FREE: Free T4: 0.9 ng/dL (ref 0.8–1.8)

## 2022-07-28 NOTE — Progress Notes (Unsigned)
  Care Coordination  Outreach Note  07/28/2022 Name: KRIMSON MASSMANN MRN: [id number] DOB: 06-04-1966   Care Coordination Outreach Attempts: An unsuccessful telephone outreach was attempted today to offer the patient information about available care coordination services as a benefit of their health plan.   Referral received   Follow Up Plan:  Additional outreach attempts will be made to offer the patient care coordination information and services.   Encounter Outcome:  No Answer  Burman Nieves, CCMA Care Coordination Care Guide Direct Dial: [phone]

## 2022-07-28 NOTE — Progress Notes (Signed)
   Care Guide Note  07/28/2022 Name: Valerie Hamilton MRN: [id number] DOB: January 29, 1966  Referred by: Margarita Mail, DO Reason for referral : Care Coordination (Outreach to schedule with Pharm D )   Valerie Hamilton is a 56 y.o. year old female who is a primary care patient of Margarita Mail, DO. Valerie Hamilton was referred to the pharmacist for assistance related to DM.    Successful contact was made with the patient to discuss pharmacy services including being ready for the pharmacist to call at least 5 minutes before the scheduled appointment time, to have medication bottles and any blood sugar or blood pressure readings ready for review. The patient agreed to meet with the pharmacist via with the pharmacist via telephone visit on (date/time).  08/21/2022  Penne Lash, RMA Care Guide [facility]  [place], [place] [address] Direct Dial: [phone] [email or web address]

## 2022-07-29 ENCOUNTER — Other Ambulatory Visit: Payer: Self-pay | Admitting: Internal Medicine

## 2022-07-29 DIAGNOSIS — E119 Type 2 diabetes mellitus without complications: Secondary | ICD-10-CM

## 2022-07-31 ENCOUNTER — Encounter: Payer: Self-pay | Admitting: Family Medicine

## 2022-07-31 ENCOUNTER — Encounter: Payer: Self-pay | Admitting: Internal Medicine

## 2022-07-31 NOTE — Telephone Encounter (Signed)
Unable to refill per protocol, request is too soon. Last refill 04/14/22 for 18 ml and 1 refill. Will refuse.  Requested Prescriptions  Pending Prescriptions Disp Refills   Insulin Glargine (BASAGLAR KWIKPEN) 100 UNIT/ML [Pharmacy Med Name: BASAGLAR 100 UNIT/ML KWIKPEN]  1    Sig: INJECT 20 UNITS INTO THE SKIN DAILY     Endocrinology:  Diabetes - Insulins Failed - 07/29/2022  5:11 PM      Failed - HBA1C is between 0 and 7.9 and within 180 days    Hemoglobin A1C  Date Value Ref Range Status  06/23/2020 11.5  Final   Hgb A1c MFr Bld  Date Value Ref Range Status  07/27/2022 10.7 (H) <5.7 % of total Hgb Final    Comment:    For someone without known diabetes, a hemoglobin A1c value of 6.5% or greater indicates that they may have  diabetes and this should be confirmed with a follow-up  test. . For someone with known diabetes, a value <7% indicates  that their diabetes is well controlled and a value  greater than or equal to 7% indicates suboptimal  control. A1c targets should be individualized based on  duration of diabetes, age, comorbid conditions, and  other considerations. . Currently, no consensus exists regarding use of hemoglobin A1c for diagnosis of diabetes for children. Verna Czech - Valid encounter within last 6 months    Recent Outpatient Visits           4 days ago Type 2 diabetes mellitus without complication, without long-term current use of insulin [facility])   [facility] Danelle Berry, PA-C   3 months ago Type 2 diabetes mellitus without complication, without long-term current use of insulin [facility])   [facility] Margarita Mail, DO   4 months ago Encounter for medical examination to establish care   [facility] Margarita Mail, DO       Future Appointments             In 2 months Margarita Mail, DO [facility], [facility]

## 2022-07-31 NOTE — Progress Notes (Unsigned)
  Care Coordination  Outreach Note  07/31/2022 Name: Valerie Hamilton MRN: [id number] DOB: 1966/03/14   Care Coordination Outreach Attempts: A second unsuccessful outreach was attempted today to offer the patient with information about available care coordination services as a benefit of their health plan.     Referral received   Follow Up Plan:  Additional outreach attempts will be made to offer the patient care coordination information and services.   Encounter Outcome:  No Answer  Burman Nieves, CCMA Care Coordination Care Guide Direct Dial: [phone]

## 2022-08-01 ENCOUNTER — Other Ambulatory Visit: Payer: Self-pay

## 2022-08-01 ENCOUNTER — Other Ambulatory Visit: Payer: Self-pay | Admitting: Internal Medicine

## 2022-08-01 DIAGNOSIS — E119 Type 2 diabetes mellitus without complications: Secondary | ICD-10-CM

## 2022-08-01 MED ORDER — BASAGLAR KWIKPEN 100 UNIT/ML ~~LOC~~ SOPN: 20.0000 [IU] | PEN_INJECTOR | Freq: Every day | SUBCUTANEOUS | 1 refills | Status: DC

## 2022-08-02 NOTE — Progress Notes (Signed)
  Care Coordination  Outreach Note  08/02/2022 Name: Valerie Hamilton MRN: [id number] DOB: 04/12/1966   Care Coordination Outreach Attempts: A third unsuccessful outreach was attempted today to offer the patient with information about available care coordination services as a benefit of their health plan.   Referral received   Follow Up Plan:  No further outreach attempts will be made at this time. We have been unable to contact the patient to offer or enroll patient in care coordination services  Encounter Outcome:  No Answer  Burman Nieves, [facility] Care Coordination Care Guide Direct Dial: [phone]

## 2022-08-21 ENCOUNTER — Telehealth: Payer: Self-pay | Admitting: Pharmacist

## 2022-08-21 ENCOUNTER — Other Ambulatory Visit: Payer: Self-pay | Admitting: Pharmacist

## 2022-08-21 NOTE — Telephone Encounter (Signed)
   Outreach Note  08/21/2022 Name: Valerie Hamilton MRN: [id number] DOB: 12/05/1965  Referred by: Teodora Medici, DO Reason for referral : No chief complaint on file.   Was unable to reach patient via telephone today and have left HIPAA compliant voicemail asking patient to return my call.   Follow Up Plan: Will collaborate with Care Guide to outreach to schedule follow up with me  Wallace Cullens, PharmD, [place] [phone]

## 2022-08-24 ENCOUNTER — Telehealth: Payer: Self-pay

## 2022-08-24 NOTE — Progress Notes (Signed)
   Care Guide Note  08/24/2022 Name: Valerie Hamilton MRN: [id number] DOB: Jun 01, 1966  Referred by: Teodora Medici, DO Reason for referral : Care Coordination (Outreach to reschedule initial with Pharm d )   Valerie Hamilton is a 57 y.o. year old female who is a primary care patient of Teodora Medici, DO. Valerie Hamilton was referred to the pharmacist for assistance related to DM.    An unsuccessful telephone outreach was attempted today to contact the patient who was referred to the pharmacy team for assistance with medication assistance. Additional attempts will be made to contact the patient.   Valerie Hamilton, [place], [place] [address] Direct Dial: [phone] Valerie Hamilton

## 2022-08-31 NOTE — Progress Notes (Signed)
   Care Guide Note  08/31/2022 Name: Valerie Hamilton MRN: [id number] DOB: Aug 03, 1966  Referred by: Teodora Medici, DO Reason for referral : Care Coordination (Outreach to reschedule initial with Pharm d )   Valerie Hamilton is a 57 y.o. year old female who is a primary care patient of Teodora Medici, DO. Valerie Hamilton was referred to the pharmacist for assistance related to DM.    Successful contact was made with the patient to discuss pharmacy services including being ready for the pharmacist to call at least 5 minutes before the scheduled appointment time, to have medication bottles and any blood sugar or blood pressure readings ready for review. The patient agreed to meet with the pharmacist via with the pharmacist via telephone visit on (date/time).  09/18/2022  Noreene Larsson, [place], [place] [address] Direct Dial: [phone] [email or web address]

## 2022-09-18 ENCOUNTER — Other Ambulatory Visit: Payer: Self-pay | Admitting: Pharmacist

## 2022-09-18 ENCOUNTER — Telehealth: Payer: Self-pay | Admitting: Pharmacist

## 2022-09-18 NOTE — Progress Notes (Signed)
   Outreach Note  09/18/2022 Name: Valerie Hamilton MRN: [id number] DOB: May 02, 1966  Referred by: Teodora Medici, DO Reason for referral : No chief complaint on file.   Was unable to reach patient via telephone today x 2 and have left HIPAA compliant voicemail asking patient to return my call.   Follow Up Plan: Will attempt to reach patient by telephone again within the next 14 days  Wallace Cullens, PharmD, [facility] [phone]

## 2022-09-20 ENCOUNTER — Telehealth: Payer: Self-pay | Admitting: Pharmacist

## 2022-09-20 ENCOUNTER — Other Ambulatory Visit: Payer: Self-pay | Admitting: Pharmacist

## 2022-09-20 NOTE — Telephone Encounter (Signed)
   Outreach Note  09/20/2022 Name: KENIDEE CREGAN MRN: [id number] DOB: February 23, 1966  Referred by: Teodora Medici, DO Reason for referral : No chief complaint on file.   Was unable to reach patient via telephone today and have left HIPAA compliant voicemail asking patient to return my call. Outreach attempt #3.    Follow Up Plan: Will notify PCP of unsuccessful attempts to reach patient. Note patient scheduled for appointment with PCP on 3/15. Will ask provider to have patient call Care Guide team if still interested in scheduling an appointment   Wallace Cullens, PharmD, [place] [phone]

## 2022-10-11 ENCOUNTER — Other Ambulatory Visit: Payer: Self-pay | Admitting: Family Medicine

## 2022-10-11 DIAGNOSIS — E119 Type 2 diabetes mellitus without complications: Secondary | ICD-10-CM

## 2022-10-23 ENCOUNTER — Encounter: Payer: Self-pay | Admitting: Family Medicine

## 2022-10-27 ENCOUNTER — Ambulatory Visit: Payer: 59 | Admitting: Internal Medicine

## 2022-10-30 ENCOUNTER — Other Ambulatory Visit: Payer: Self-pay | Admitting: Internal Medicine

## 2022-10-30 ENCOUNTER — Telehealth: Payer: Self-pay

## 2022-10-30 DIAGNOSIS — E119 Type 2 diabetes mellitus without complications: Secondary | ICD-10-CM

## 2022-10-30 MED ORDER — LANTUS SOLOSTAR 100 UNIT/ML ~~LOC~~ SOPN: 20.0000 [IU] | PEN_INJECTOR | Freq: Every day | SUBCUTANEOUS | 0 refills | Status: DC

## 2022-10-30 NOTE — Telephone Encounter (Signed)
Please switch basaglar insulin to either lantus or levemir.  These are preffered and she got new ins.

## 2022-10-30 NOTE — Telephone Encounter (Signed)
Pt having transportation issues therefore appt has been sch'd for first week in April. Pt has been informed that the lantus has been sent in however she is also inquiring about her other insulin (please refer to last phone encounter 3.11.24. She has been out of her meds for a couple of months.

## 2022-11-02 ENCOUNTER — Ambulatory Visit: Payer: Self-pay

## 2022-11-02 NOTE — Telephone Encounter (Signed)
Pt is calling to report that she is in need of the  insulin glargine (LANTUS SOLOSTAR) 100 UNIT/ML Solostar Pen [id number] & liraglutide ([place]) 18 MG/3ML SOPN [id number] . Pt has been out of medication for over a month. Starting to have sx of increased urination and increased thirst, with vaginal itching and burning. Pt is having transportation concerns and unable to come in sooner. Pt is open to see if medications can be changed if PA is not approved. Please advise CB- [phone]

## 2022-11-02 NOTE — Telephone Encounter (Signed)
See Nurse Triage encounter

## 2022-11-02 NOTE — Telephone Encounter (Signed)
  Chief Complaint: Out of diabetes medication - current blood sugar is 426 Symptoms: frequent urination Frequency:  Pertinent Negatives: Patient denies Dizziness, vomiting Disposition: [] ED /[] Urgent Care (no appt availability in office) / [] Appointment(In office/virtual)/ []  [facility]/ [] Home Care/ [] Refused Recommended Disposition /[] [facility]/ [x]  Follow-up with PCP Additional Notes: PT was calling because she is out of diabetes medication and needs a PA for medications. Current BS is 426. Other than frequent urination pt has no s/s. Pt now has insurance, "[employer]". She would like medication called in that this insurance will cover, or needs a PA quickly.   Pt will monitor sugar and s/s and seek immediate care if needed.  Pt does report vaginal itching. PT has an rx for yeast medication at the pharmacy.  Please advise.     Summary: Vaginal itching, increased urination, with increased thirst Advice   Pt has been off of diabetic meds for one month due to insurance issues and awaiting approval of PA. Pt is  starting to have sx of increased urination, increased thirst, and vaginal itching. Pt can not come into the office due to lack of transportation. Message sent to provider to ask if diabetic medications can be changed to medications that are approved by the pts insurance. Please advise       Reason for Disposition  Blood glucose > 400 mg/dL (22.2 mmol/L)  Answer Assessment - Initial Assessment Questions 1. BLOOD GLUCOSE: "What is your blood glucose level?"      426 2. ONSET: "When did you check the blood glucose?"     A few minutes ago 3. USUAL RANGE: "What is your glucose level usually?" (e.g., usual fasting morning value, usual evening value)      4. KETONES: "Do you check for ketones (urine or blood test strips)?" If Yes, ask: "What does the test show now?"       5. TYPE 1 or 2:  "Do you know what type of diabetes you have?"  (e.g., Type 1, Type  2, Gestational; doesn't know)      2 6. INSULIN: "Do you take insulin?" "What type of insulin(s) do you use? What is the mode of delivery? (syringe, pen; injection or pump)?"      Out of meds 7. DIABETES PILLS: "Do you take any pills for your diabetes?" If Yes, ask: "Have you missed taking any pills recently?"      8. OTHER SYMPTOMS: "Do you have any symptoms?" (e.g., fever, frequent urination, difficulty breathing, dizziness, weakness, vomiting)     Frequent urination  Protocols used: Diabetes - High Blood Sugar-A-AH

## 2022-11-02 NOTE — Telephone Encounter (Signed)
Patient called, left VM to return the call to the office to discuss symptoms with a nurse.  Summary: Vaginal itching, increased urination, with increased thirst Advice   Pt has been off of diabetic meds for one month due to insurance issues and awaiting approval of PA. Pt is  starting to have sx of increased urination, increased thirst, and vaginal itching. Pt can not come into the office due to lack of transportation. Message sent to provider to ask if diabetic medications can be changed to medications that are approved by the pts insurance. Please advise

## 2022-11-03 ENCOUNTER — Other Ambulatory Visit: Payer: Self-pay | Admitting: Internal Medicine

## 2022-11-03 ENCOUNTER — Telehealth (INDEPENDENT_AMBULATORY_CARE_PROVIDER_SITE_OTHER): Payer: 59 | Admitting: Nurse Practitioner

## 2022-11-03 ENCOUNTER — Other Ambulatory Visit: Payer: Self-pay | Admitting: Nurse Practitioner

## 2022-11-03 ENCOUNTER — Other Ambulatory Visit: Payer: Self-pay

## 2022-11-03 ENCOUNTER — Telehealth: Payer: Self-pay

## 2022-11-03 DIAGNOSIS — E1165 Type 2 diabetes mellitus with hyperglycemia: Secondary | ICD-10-CM

## 2022-11-03 MED ORDER — OZEMPIC (0.25 OR 0.5 MG/DOSE) 2 MG/3ML ~~LOC~~ SOPN: 0.5000 mg | PEN_INJECTOR | SUBCUTANEOUS | 0 refills | Status: DC

## 2022-11-03 MED ORDER — GVOKE HYPOPEN 1-PACK 1 MG/0.2ML ~~LOC~~ SOAJ: 1.0000 mg | Freq: Once | SUBCUTANEOUS | 5 refills | Status: AC | PRN

## 2022-11-03 MED ORDER — OZEMPIC (0.25 OR 0.5 MG/DOSE) 2 MG/3ML ~~LOC~~ SOPN: 0.5000 mg | PEN_INJECTOR | SUBCUTANEOUS | 0 refills | Status: AC

## 2022-11-03 MED ORDER — TOUJEO MAX SOLOSTAR 300 UNIT/ML ~~LOC~~ SOPN: 20.0000 [IU] | PEN_INJECTOR | Freq: Every day | SUBCUTANEOUS | 15 refills | Status: DC

## 2022-11-03 NOTE — Progress Notes (Signed)
Name: Valerie Hamilton   MRN: [id number]    DOB: Nov 23, 1965   Date:11/03/2022       Progress Note  Subjective  Chief Complaint  Chief Complaint  Patient presents with   Diabetes    Victoza not covered wants to switch back to ozempic.  Patient also states lantus not cover, she is currently not on any insulin and blood sugars running high 400's.  I told patient to call pharmacy/insurance to see what insulin they cover.    I connected with  Sofie Hartigan  on 11/03/22 at  1:00 PM EDT by a video enabled telemedicine application and verified that I am speaking with the correct person using two identifiers.  I discussed the limitations of evaluation and management by telemedicine and the availability of in person appointments. The patient expressed understanding and agreed to proceed with a virtual visit  Staff also discussed with the patient that there may be a patient responsible charge related to this service. Patient Location: home Provider Location: [facility] Additional Individuals present: alone  HPI  Diabetes: patient reports that she was on victoza and lantus but insurance does not cover it. She says she has been out of medication for 2 months.  Patient reports her blood sugars have been in the 400s.  Her last A1C was 10.7 on 07/27/2022. She says that she would rather be on ozempic because she had better results when she was on it before.  She will need to start at 0.25 mg weekly since she has not been on the victoza for two months. CMA called and verified coverage with insurance company and ozempic and insulin glargine are on the preferred list. Will send that in to the pharmacy. Also will provide sample for patient. Patient to start ozempic 0.25 mg for first month and then increase to 0.5 mg the next month.  Also sending in hypopen for emergency use for hypoglycemia.   Patient Active Problem List   Diagnosis Date Noted   Episode of recurrent major depressive disorder ([place])  07/27/2022   Primary osteoarthritis of left knee 01/05/2020   Primary osteoarthritis of right knee 09/20/2018   Depression, controlled 09/21/2015   Ovarian cyst, right 05/04/2014   Morbid obesity due to excess calories ([place]) 05/04/2014   Left wrist fracture 04/22/2014   Anemia 03/27/2014   Type 2 diabetes mellitus without complication, without long-term current use of insulin ([place]) 01/08/2014   Sciatica 01/08/2014   HTN (hypertension) 01/08/2014    Social History   Tobacco Use   Smoking status: Never   Smokeless tobacco: Not on file  Substance Use Topics   Alcohol use: Yes     Current Outpatient Medications:    atorvastatin (LIPITOR) 20 MG tablet, Take 1 tablet (20 mg total) by mouth daily., Disp: 90 tablet, Rfl: 1   Glucagon (GVOKE HYPOPEN 1-PACK) 1 MG/0.2ML SOAJ, Inject 1 mg into the skin once as needed for up to 1 dose (for hypoglycemia)., Disp: 0.2 mL, Rfl: 5   insulin glargine, 2 Unit Dial, (TOUJEO MAX SOLOSTAR) 300 UNIT/ML Solostar Pen, Inject 20 Units into the skin daily., Disp: 3 mL, Rfl: 15   Iron, Ferrous Sulfate, 325 (65 Fe) MG TABS, Take 325 mg by mouth daily., Disp: 90 tablet, Rfl: 1   LORazepam (ATIVAN) 0.5 MG tablet, Take 1-2 tablets (0.5-1 mg total) by mouth 2 (two) times daily as needed for anxiety (severe panic attacks/flash backs/grief, use sparingly)., Disp: 20 tablet, Rfl: 0   sertraline (ZOLOFT) 100 MG  tablet, Take 1-1.5 tablets (100-150 mg total) by mouth daily., Disp: 135 tablet, Rfl: 0   Accu-Chek Softclix Lancets lancets, Use as instructed (Patient not taking: Reported on 11/03/2022), Disp: 100 each, Rfl: 12   blood glucose meter kit and supplies, Dispense based on patient and insurance preference. Use up to four times daily as directed. (FOR ICD-10 E10.9, E11.9). (Patient not taking: Reported on 11/03/2022), Disp: 1 each, Rfl: 0   Blood Pressure Monitoring (ADULT BLOOD PRESSURE CUFF LG) KIT, 1 each by Does not apply route daily. (Patient not taking: Reported  on 11/03/2022), Disp: 1 kit, Rfl: 0   fluconazole (DIFLUCAN) 150 MG tablet, Take 1 tablet (150 mg total) by mouth every 3 (three) days as needed (for vaginal itching/yeast infection sx). (Patient not taking: Reported on 11/03/2022), Disp: 2 tablet, Rfl: 2   glucose blood (ACCU-CHEK GUIDE) test strip, Use as instructed (Patient not taking: Reported on 11/03/2022), Disp: 100 each, Rfl: 12   Insulin Pen Needle (CARETOUCH PEN NEEDLES) 32G X 4 MM MISC, 1 each by Does not apply route daily. (Patient not taking: Reported on 11/03/2022), Disp: 100 each, Rfl: 3   Semaglutide,0.25 or 0.5MG /DOS, (OZEMPIC, 0.25 OR 0.5 MG/DOSE,) 2 MG/3ML SOPN, Inject 0.5 mg into the skin once a week., Disp: 3 mL, Rfl: 0  Allergies  Allergen Reactions   Avelox [Moxifloxacin Hcl In Nacl] Itching   Lisinopril Cough    I personally reviewed active problem list, medication list, allergies, notes from last encounter with the patient/caregiver today.  ROS  Constitutional: Negative for fever or weight change.  Respiratory: Negative for cough and shortness of breath.   Cardiovascular: Negative for chest pain or palpitations.  Gastrointestinal: Negative for abdominal pain, no bowel changes.  Musculoskeletal: Negative for gait problem or joint swelling.  Skin: Negative for rash.  Neurological: Negative for dizziness or headache.  No other specific complaints in a complete review of systems (except as listed in HPI above).   Objective  Virtual encounter, vitals not obtained.  There is no height or weight on file to calculate BMI.  Nursing Note and Vital Signs reviewed.  Physical Exam  Awake, alert and oriented, speaking in complete sentences.  No results found for this or any previous visit (from the past 72 hour(s)).  Assessment & Plan  1. Uncontrolled type 2 diabetes mellitus with hyperglycemia ([place]) She has been out of medication for two months. She recently had new prescriptions sent in but not covered.  Pick up  samples of toujeo and ozempic from the office Start ozempic 0.25 mg weekly, toujeo 20 units daily.    - insulin glargine, 2 Unit Dial, (TOUJEO MAX SOLOSTAR) 300 UNIT/ML Solostar Pen; Inject 20 Units into the skin daily.  Dispense: 3 mL; Refill: 15 - Glucagon (GVOKE HYPOPEN 1-PACK) 1 MG/0.2ML SOAJ; Inject 1 mg into the skin once as needed for up to 1 dose (for hypoglycemia).  Dispense: 0.2 mL; Refill: 5 - Semaglutide,0.25 or 0.5MG /DOS, (OZEMPIC, 0.25 OR 0.5 MG/DOSE,) 2 MG/3ML SOPN; Inject 0.5 mg into the skin once a week.  Dispense: 3 mL; Refill: 0   -Red flags and when to present for emergency care or RTC including fever >101.73F, chest pain, shortness of breath, new/worsening/un-resolving symptoms,  reviewed with patient at time of visit. Follow up and care instructions discussed and provided in AVS. - I discussed the assessment and treatment plan with the patient. The patient was provided an opportunity to ask questions and all were answered. The patient agreed with the plan  and demonstrated an understanding of the instructions.  I provided 15 minutes of non-face-to-face time during this encounter.  Bo Merino, FNP

## 2022-11-03 NOTE — Telephone Encounter (Signed)
Has appt

## 2022-11-03 NOTE — Telephone Encounter (Signed)
Tried to do PA for victoza and Lantus.  If denied per prior auth ins prefers Trulicity or ozempic and for lantus they prefer levemir flextouch or insulin glargine solostar

## 2022-11-06 NOTE — Telephone Encounter (Signed)
Requested medications are due for refill today.  See note  Requested medications are on the active medications list.    Last refill.   Future visit scheduled.   yes  Notes to clinic.    Pharmacy comment: Alternative Requested:NOT COVERED.     Requested Prescriptions  Pending Prescriptions Disp Refills   TOUJEO MAX SOLOSTAR 300 UNIT/ML Solostar Pen [Pharmacy Med Name: TOUJEO MAX SOLOSTR 300 UNIT/ML]  15    Sig: Inject 20 Units into the skin daily.     Endocrinology:  Diabetes - Insulins Failed - 11/03/2022  1:44 PM      Failed - HBA1C is between 0 and 7.9 and within 180 days    Hemoglobin A1C  Date Value Ref Range Status  06/23/2020 11.5  Final   Hgb A1c MFr Bld  Date Value Ref Range Status  07/27/2022 10.7 (H) <5.7 % of total Hgb Final    Comment:    For someone without known diabetes, a hemoglobin A1c value of 6.5% or greater indicates that they may have  diabetes and this should be confirmed with a follow-up  test. . For someone with known diabetes, a value <7% indicates  that their diabetes is well controlled and a value  greater than or equal to 7% indicates suboptimal  control. A1c targets should be individualized based on  duration of diabetes, age, comorbid conditions, and  other considerations. . Currently, no consensus exists regarding use of hemoglobin A1c for diagnosis of diabetes for children. Renella Cunas - Valid encounter within last 6 months    Recent Outpatient Visits           3 days ago Uncontrolled type 2 diabetes mellitus with hyperglycemia [facility])   [facility] Serafina Royals F, FNP   3 months ago Type 2 diabetes mellitus without complication, without long-term current use of insulin [facility])   [facility] Delsa Grana, PA-C   6 months ago Type 2 diabetes mellitus without complication, without long-term current use of insulin [facility])   [place], DO   7 months ago Encounter for medical examination to establish care   [facility] Teodora Medici, DO       Future Appointments             In 1 week Teodora Medici, [facility], [facility]

## 2022-11-07 ENCOUNTER — Other Ambulatory Visit: Payer: Self-pay | Admitting: Internal Medicine

## 2022-11-07 MED ORDER — LANTUS SOLOSTAR 100 UNIT/ML ~~LOC~~ SOPN: 20.0000 [IU] | PEN_INJECTOR | Freq: Every day | SUBCUTANEOUS | 99 refills | Status: AC

## 2022-11-13 NOTE — Progress Notes (Deleted)
Established Patient Office Visit  Subjective    Patient ID: Valerie Hamilton, female    DOB: Sep 15, 1965  Age: 57 y.o. MRN: [id number]  CC:  No chief complaint on file.   HPI Valerie Hamilton presents for follow up.   Hypertension: -First diagnosed before 11/26/2014 -Medications: Nothing currently -Had been treated with HCTZ in the past but is not taking currently -Checking BP at home (average): Not checking  -Denies any SOB, CP, vision changes, LE edema or symptoms of hypotension  Diabetes, Type 2: -Last A1c 8/23 11.3 -Medications: Lantus 20 units, Ozempic 0.5 mg ?  -Had been on Lantus and Ozempic in the past but not on anything currently - tolerated well previously but could not afford without her insurance  -Checking BG at home: Not checking, needs supplies -Eye exam: Due -scheduled in October  -Foot exam: UTD 8/23 -Microalbumin: UTD 8/23 -Statin: yes ? -PNA vaccine: Discuss at follow-up -Denies symptoms of hypoglycemia, polyuria, polydipsia, numbness extremities, foot ulcers/trauma.   HLD: -Medications: Lipitor 20 mg -Last lipid panel: Lipid Panel     Component Value Date/Time   CHOL 183 03/30/2022 1205   TRIG 92 03/30/2022 1205   HDL 46 (L) 03/30/2022 1205   CHOLHDL 4.0 03/30/2022 1205   LDLCALC 117 (H) 03/30/2022 1205   The 10-year ASCVD risk score (Arnett DK, et al., Nov 25, 2017) is: 7.6%   Values used to calculate the score:     Age: 89 years     Sex: Female     Is Non-Hispanic African American: Yes     Diabetic: Yes     Tobacco smoker: No     Systolic Blood Pressure: 0000000 mmHg     Is BP treated: No     HDL Cholesterol: 46 mg/dL     Total Cholesterol: 183 mg/dL   MDD: -Mood status: better -Current treatment: Zoloft 100 mg, increased at LOV -Symptom severity: severe - patient's son passed away from [place] in Nov 26, 2019 Psychotherapy/counseling: no  Previous psychiatric medications: zoloft Depressed mood: yes     11/03/2022    9:54 AM 07/27/2022   11:08 AM  07/27/2022   11:00 AM 04/12/2022    9:58 AM 03/30/2022   11:13 AM  Depression screen PHQ 2/9  Decreased Interest 1 2 0 1 3  Down, Depressed, Hopeless 1 2 0 2 3  PHQ - 2 Score 2 4 0 3 6  Altered sleeping 1 1 0 0 3  Tired, decreased energy 1 2 0 0 3  Change in appetite 1 0 0 0 3  Feeling bad or failure about yourself  1 0 0 0 2  Trouble concentrating 1 0 0 0 2  Moving slowly or fidgety/restless 1 0 0 0 0  Suicidal thoughts 0 0 0 0 0  PHQ-9 Score 8 7 0 3 19  Difficult doing work/chores Somewhat difficult Somewhat difficult Not difficult at all Not difficult at all    Abnormal Vaginal Bleeding/Iron Deficiency Anemia: -Having irregular periods, will continuously bleed for several weeks -Not currently bleeding, periods becoming more irregular  -Had been on combined OCPs in the past, this was discontinued secondary to either blood pressure issues or history of migraines -Has been on Depo in the past, however this caused a large amount of weight gain. -Last Pap 3/20 which was negative -Started on PO iron which she is taking every other day - does make her constipated but will start a stool softener to take with it.   Health Maintenance: -Blood  work UTD -Mammogram due, not yet scheduled -Colon cancer screening due - will discuss at follow up  Outpatient Encounter Medications as of 11/14/2022  Medication Sig   Accu-Chek Softclix Lancets lancets Use as instructed (Patient not taking: Reported on 11/03/2022)   atorvastatin (LIPITOR) 20 MG tablet Take 1 tablet (20 mg total) by mouth daily.   blood glucose meter kit and supplies Dispense based on patient and insurance preference. Use up to four times daily as directed. (FOR ICD-10 E10.9, E11.9). (Patient not taking: Reported on 11/03/2022)   Blood Pressure Monitoring (ADULT BLOOD PRESSURE CUFF LG) KIT 1 each by Does not apply route daily. (Patient not taking: Reported on 11/03/2022)   fluconazole (DIFLUCAN) 150 MG tablet Take 1 tablet (150 mg total)  by mouth every 3 (three) days as needed (for vaginal itching/yeast infection sx). (Patient not taking: Reported on 11/03/2022)   Glucagon (GVOKE HYPOPEN 1-PACK) 1 MG/0.2ML SOAJ Inject 1 mg into the skin once as needed for up to 1 dose (for hypoglycemia).   glucose blood (ACCU-CHEK GUIDE) test strip Use as instructed (Patient not taking: Reported on 11/03/2022)   insulin glargine (LANTUS SOLOSTAR) 100 UNIT/ML Solostar Pen Inject 20 Units into the skin daily.   Insulin Pen Needle (CARETOUCH PEN NEEDLES) 32G X 4 MM MISC 1 each by Does not apply route daily. (Patient not taking: Reported on 11/03/2022)   Iron, Ferrous Sulfate, 325 (65 Fe) MG TABS Take 325 mg by mouth daily.   LORazepam (ATIVAN) 0.5 MG tablet Take 1-2 tablets (0.5-1 mg total) by mouth 2 (two) times daily as needed for anxiety (severe panic attacks/flash backs/grief, use sparingly).   Semaglutide,0.25 or 0.5MG /DOS, (OZEMPIC, 0.25 OR 0.5 MG/DOSE,) 2 MG/3ML SOPN Inject 0.5 mg into the skin once a week.   sertraline (ZOLOFT) 100 MG tablet Take 1-1.5 tablets (100-150 mg total) by mouth daily.   No facility-administered encounter medications on file as of 11/14/2022.    Past Medical History:  Diagnosis Date   Anxiety    Diabetes mellitus without complication ([place])    Hypertension     Past Surgical History:  Procedure Laterality Date   CHOLECYSTECTOMY     WRIST SURGERY Left     No family history on file.  Social History   Socioeconomic History   Marital status: Single    Spouse name: Not on file   Number of children: Not on file   Years of education: Not on file   Highest education level: Not on file  Occupational History   Not on file  Tobacco Use   Smoking status: Never   Smokeless tobacco: Not on file  Substance and Sexual Activity   Alcohol use: Yes   Drug use: Not on file   Sexual activity: Not on file  Other Topics Concern   Not on file  Social History Narrative   Not on file   Social Determinants of Health    Financial Resource Strain: Not on file  Food Insecurity: Not on file  Transportation Needs: Not on file  Physical Activity: Not on file  Stress: Not on file  Social Connections: Not on file  Intimate Partner Violence: Not on file    Review of Systems  Constitutional:  Negative for chills and fever.  Eyes:  Negative for blurred vision.  Respiratory:  Negative for shortness of breath.   Cardiovascular:  Negative for chest pain.  Neurological:  Negative for dizziness.        Objective    There were no  vitals taken for this visit.  Physical Exam Constitutional:      Appearance: Normal appearance. She is obese.  HENT:     Head: Normocephalic and atraumatic.  Eyes:     Conjunctiva/sclera: Conjunctivae normal.  Cardiovascular:     Rate and Rhythm: Normal rate and regular rhythm.     Pulses:          Dorsalis pedis pulses are 2+ on the right side and 2+ on the left side.  Pulmonary:     Effort: Pulmonary effort is normal.     Breath sounds: Normal breath sounds.  Musculoskeletal:     Right lower leg: Edema present.     Left lower leg: Edema present.     Right foot: Normal range of motion. No deformity, bunion, Charcot foot, foot drop or prominent metatarsal heads.     Left foot: Normal range of motion. No deformity, bunion, Charcot foot, foot drop or prominent metatarsal heads.     Comments: Chronic BLE swelling, worse on the left   Feet:     Right foot:     Protective Sensation: 6 sites tested.  6 sites sensed.     Skin integrity: Skin integrity normal.     Toenail Condition: Right toenails are normal.     Left foot:     Protective Sensation: 6 sites tested.       Skin integrity: Skin integrity normal.     Toenail Condition: Left toenails are normal.  Lymphadenopathy:     Cervical: No cervical adenopathy.  Skin:    General: Skin is warm and dry.  Neurological:     General: No focal deficit present.     Mental Status: She is alert. Mental status is at baseline.   Psychiatric:        Mood and Affect: Mood normal.        Behavior: Behavior normal.         Assessment & Plan:   1. Type 2 diabetes mellitus without complication, without long-term current use of insulin ([place]): A1c uncontrolled at 11.3.  We will restart her Lantus 20 units at night.  We will start the lowest dose of Trulicity as well.  A new blood glucose meter kit and supplies was [facility] to [facility] so patient can start checking her fasting sugars.  She has a diabetic eye exam next month.  Diabetic foot exam today.  Follow-up in 1 month to recheck.  - insulin glargine (LANTUS) 100 UNIT/ML injection; Inject 0.2 mLs (20 Units total) into the skin daily.  Dispense: 10 mL; Refill: 3 - Dulaglutide (TRULICITY) [id number] SOPN; Inject 0.75 mg into the skin once a week.  Dispense: 2 mL; Refill: 0 - blood glucose meter kit and supplies; Dispense based on patient and insurance preference. Use up to four times daily as directed. (FOR ICD-10 E10.9, E11.9).  Dispense: 1 each; Refill: 0 - [facility] Diabetes Foot Exam  2. Hypertension, unspecified type: Blood pressure slightly elevated today.  No medication started today, we will recheck it again next month.  If it is still elevated will start an ACE or an ARB for renal protection.  3. Moderate mixed hyperlipidemia not requiring statin therapy: LDL elevated at 117, ASCVD risk elevated at 10.8%.  Patient had been on Lipitor in the past, will restart Lipitor 20 mg daily.  - atorvastatin (LIPITOR) 20 MG tablet; Take 1 tablet (20 mg total) by mouth daily.  Dispense: 90 tablet; Refill: 1  4. Moderate episode of recurrent major  depressive disorder [facility]): Zoloft dose increased to 100 mg daily.  Recheck at follow-up.  - sertraline (ZOLOFT) 100 MG tablet; Take 1 tablet (100 mg total) by mouth daily.  Dispense: 30 tablet; Refill: 1  5. Abnormal vaginal bleeding/Anemia, unspecified type: Hemoglobin level normal, however ferritin low at 11.  She was started on oral  iron therapy and is taking it every other day with vitamin C and stool softeners.  She has had a vaginal ultrasound in the past, which was only significant for a small ovarian cyst.  Her periods are becoming more irregular, she would like to wait before referral to gynecology as the patient is most likely peri-menopausal at this point.    No follow-ups on file.   Teodora Medici, DO

## 2022-11-14 ENCOUNTER — Ambulatory Visit: Payer: 59 | Admitting: Internal Medicine

## 2023-02-14 ENCOUNTER — Telehealth: Payer: Self-pay

## 2023-02-14 NOTE — Telephone Encounter (Signed)
Chart review completed for patient. Patient is due for screening mammogram. Mychart message sent to patient to inquire about scheduling mammogram.  Annsley Akkerman, Population Health Specialist.
